# Patient Record
Sex: Female | Born: 1995 | Race: Black or African American | Hispanic: No | Marital: Single | Smoking: Never smoker
Health system: Southern US, Community
[De-identification: ages and names within clinical notes are randomized; demographics above are authoritative.]

## PROBLEM LIST (undated history)

## (undated) DIAGNOSIS — G8250 Quadriplegia, unspecified: Secondary | ICD-10-CM

## (undated) HISTORY — PX: TRACHEOSTOMY: SUR1362

---

## 2016-11-13 HISTORY — PX: GASTROSTOMY W/ FEEDING TUBE: SUR642

## 2017-12-02 ENCOUNTER — Other Ambulatory Visit: Payer: Self-pay | Admitting: *Deleted

## 2017-12-02 ENCOUNTER — Ambulatory Visit: Payer: Medicaid Other | Admitting: Podiatry

## 2017-12-02 ENCOUNTER — Encounter: Payer: Self-pay | Admitting: Podiatry

## 2017-12-02 DIAGNOSIS — L600 Ingrowing nail: Secondary | ICD-10-CM | POA: Diagnosis not present

## 2017-12-02 DIAGNOSIS — L03032 Cellulitis of left toe: Secondary | ICD-10-CM

## 2017-12-02 MED ORDER — CEPHALEXIN 500 MG PO CAPS: 500.0000 mg | ORAL_CAPSULE | Freq: Two times a day (BID) | ORAL | 0 refills | Status: AC

## 2017-12-02 NOTE — Progress Notes (Signed)
This patient presents to the office with chief complaint of a possible ingrown toenail right foot.  She is brought to the office by EMS for her painful big toe.  She is also accompanied by a caregiver. 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  The guardian states that she's had significant pain and discomfort upon pressure to the right big toe.  She has a red growth noted along the inside border right big toe.  She has had Neosporin applied to the site, but the problem persists.  Patient also is taking eliquiss.  She presents the office today for an evaluation and treatment of her painful ingrown toenail.  General Appearance  Alert, conversant and in no acute stress.  Vascular  Dorsalis pedis and posterior tibial  pulses are palpable  bilaterally.  Capillary return is within normal limits  bilaterally. Temperature is within normal limits  bilaterally.  Neurologic  Deferred due to treating patient in her stretcher.  Nails marked incurvation noted along the medial border of the right great toenail with significant amount of granulation tissue.  Orthopedic  No limitations of motion of motion feet .  No crepitus or effusions noted.  No bony pathology or digital deformities noted.  Skin  normotropic skin with no porokeratosis noted bilaterally.  No signs of infections or ulcers noted.    Paronychia right hallux  Ingrown toenail right hallux.  IE  Incision and drainage of right great toenail.    Treatment options and alternatives discussed.  Recommended an incision and drainage and guardian  agreed.  Right hallux  was prepped with alcohol and a 3cc. of  2% lidocaine plain was administered in a digital block fashion.  The toe was then prepped with betadine solution .  The offending nail border was then excised and all granulation  tissue was resected.  The area was then cleansed  and antibiotic ointment and a dry sterile dressing was applied. F ollowing  the procedure she had a tendency to bleed.  During the procedure the guardian stated she had stopped her Eliquiss. since she felt nail surgery was required.  Phenol was also used to cauterize the granulation tissue and limit bleeding.  She was held in the stretcher until signs of bleeding stopped. The patient was dispensed instructions for aftercare. Cephalexin 500 mg was prescribed.  Home instructions were reviewed with guardian.  Home care instructions were sent to the home health workers.  RTC 2 weeks as needed.     Helane GuntherGregory Raylyn Carton DPM

## 2017-12-09 ENCOUNTER — Ambulatory Visit: Payer: Medicaid Other | Admitting: Podiatry

## 2018-01-30 ENCOUNTER — Ambulatory Visit
Admission: RE | Admit: 2018-01-30 | Discharge: 2018-01-30 | Disposition: A | Discharge status: Home / Self Care | Payer: Medicaid Other | Source: Ambulatory Visit | Attending: Family Medicine | Admitting: Family Medicine

## 2018-01-30 ENCOUNTER — Other Ambulatory Visit: Payer: Self-pay | Admitting: Family Medicine

## 2018-01-30 DIAGNOSIS — M439 Deforming dorsopathy, unspecified: Secondary | ICD-10-CM | POA: Diagnosis not present

## 2018-07-22 ENCOUNTER — Other Ambulatory Visit: Payer: Self-pay

## 2018-07-22 ENCOUNTER — Encounter: Payer: Medicaid Other | Attending: Physician Assistant | Admitting: Physician Assistant

## 2018-07-22 DIAGNOSIS — Z7901 Long term (current) use of anticoagulants: Secondary | ICD-10-CM | POA: Insufficient documentation

## 2018-07-22 DIAGNOSIS — Z930 Tracheostomy status: Secondary | ICD-10-CM | POA: Diagnosis not present

## 2018-07-22 DIAGNOSIS — Z87820 Personal history of traumatic brain injury: Secondary | ICD-10-CM | POA: Insufficient documentation

## 2018-07-22 DIAGNOSIS — G8250 Quadriplegia, unspecified: Secondary | ICD-10-CM | POA: Diagnosis not present

## 2018-07-22 DIAGNOSIS — L89313 Pressure ulcer of right buttock, stage 3: Secondary | ICD-10-CM | POA: Diagnosis not present

## 2018-07-22 DIAGNOSIS — Z8249 Family history of ischemic heart disease and other diseases of the circulatory system: Secondary | ICD-10-CM | POA: Insufficient documentation

## 2018-07-22 DIAGNOSIS — F0390 Unspecified dementia without behavioral disturbance: Secondary | ICD-10-CM | POA: Diagnosis not present
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
## 2018-07-23 NOTE — Progress Notes (Signed)
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 Record Number: [id number] Patient Account Number: [id number] Date of Birth/Sex: 17-Aug-1995 (23 y.o. 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

## 2018-07-23 NOTE — Progress Notes (Signed)
WHITTNEY, STEENSON ([id number]) Visit Report for 07/22/2018 Allergy List Details Patient Name: Kelsey Strong, Kelsey Strong. Date of Service: 07/22/2018 1:00 PM Medical Record Number: [id number] Patient Account Number: [id number] Date of Birth/Sex: 06-02-1995 (23 y.o. F) Treating RN: Rodell Perna Primary Care Zacharius Funari: Beverely Low Other Clinician: Referring Gennette Shadix: Beverely Low Treating Lean Jaeger/Extender: STONE III, HOYT Weeks in Treatment: 0 Allergies Active Allergies no known alergies Allergy Notes Electronic Signature(s) Signed: 07/22/2018 3:57:00 PM By: Rodell Perna Entered By: Rodell Perna on 07/22/2018 13:03:48 Kelsey Strong ([id number]) -------------------------------------------------------------------------------- Arrival Information Details Patient Name: Kelsey Strong. Date of Service: 07/22/2018 1:00 PM Medical Record Number: [id number] Patient Account Number: [id number] Date of Birth/Sex: 05-Jun-1995 (23 y.o. F) Treating RN: Rodell Perna Primary Care Morty Ortwein: Beverely Low Other Clinician: Referring Marquarius Lofton: Beverely Low Treating Mamta Rimmer/Extender: Linwood Dibbles, HOYT Weeks in Treatment: 0 Visit Information Patient Arrived: Stretcher Arrival Time: 12:58 Accompanied By: mother Transfer Assistance: Stretcher Patient Identification Verified: Yes Electronic Signature(s) Signed: 07/22/2018 3:57:00 PM By: Rodell Perna Entered By: Rodell Perna on 07/22/2018 12:59:06 Kelsey Strong ([id number]) -------------------------------------------------------------------------------- Clinic Level of Care Assessment Details Patient Name: Kelsey Strong. Date of Service: 07/22/2018 1:00 PM Medical Record Number: [id number] Patient Account Number: [id number] Date of Birth/Sex: [birth year] (23 y.o. F) Treating RN: Curtis Sites Primary Care Veronika Heard: Beverely Low Other Clinician: Referring Shaneese Tait: Beverely Low Treating Kewon Statler/Extender: STONE III, HOYT Weeks in Treatment:  0 Clinic Level of Care Assessment Items TOOL 2 Quantity Score  - Use when only an EandM is performed on the INITIAL visit 0 ASSESSMENTS - Nursing Assessment / Reassessment X - General Physical Exam (combine w/ comprehensive assessment (listed just below) when 1 20 performed on new pt. evals) X- 1 25 Comprehensive Assessment (HX, ROS, Risk Assessments, Wounds Hx, etc.) ASSESSMENTS - Wound and Skin Assessment / Reassessment X - Simple Wound Assessment / Reassessment - one wound 1 5  - 0 Complex Wound Assessment / Reassessment - multiple wounds  - 0 Dermatologic / Skin Assessment (not related to wound area) ASSESSMENTS - Ostomy and/or Continence Assessment and Care  - Incontinence Assessment and Management 0  - 0 Ostomy Care Assessment and Management (repouching, etc.) PROCESS - Coordination of Care X - Simple Patient / Family Education for ongoing care 1 15  - 0 Complex (extensive) Patient / Family Education for ongoing care X- 1 10 Staff obtains Chiropractor, Records, Test Results / Process Orders  - 0 Staff telephones HHA, Nursing Homes / Clarify orders / etc  - 0 Routine Transfer to another Facility (non-emergent condition)  - 0 Routine Hospital Admission (non-emergent condition) X- 1 15 New Admissions / Manufacturing engineer / Ordering NPWT, Apligraf, etc.  - 0 Emergency Hospital Admission (emergent condition) X- 1 10 Simple Discharge Coordination  - 0 Complex (extensive) Discharge Coordination PROCESS - Special Needs  - Pediatric / Minor Patient Management 0  - 0 Isolation Patient Management Kelsey Strong, Kelsey Strong ([id number])  - 0 Hearing / Language / Visual special needs  - 0 Assessment of Community assistance (transportation, D/C planning, etc.)  - 0 Additional assistance / Altered mentation  - 0 Support Surface(s) Assessment (bed, cushion, seat, etc.) INTERVENTIONS - Wound Cleansing / Measurement X - Wound Imaging (photographs  - any number of wounds) 1 5  - 0 Wound Tracing (instead of photographs) X- 1 5 Simple Wound Measurement - one wound  - 0 Complex Wound Measurement - multiple wounds X- 1 5 Simple Wound Cleansing - one wound  - 0 Complex Wound Cleansing - multiple wounds INTERVENTIONS -  Wound Dressings X - Small Wound Dressing one or multiple wounds 1 10 []  - 0 Medium Wound Dressing one or multiple wounds []  - 0 Large Wound Dressing one or multiple wounds []  - 0 Application of Medications - injection INTERVENTIONS - Miscellaneous []  - External ear exam 0 []  - 0 Specimen Collection (cultures, biopsies, blood, body fluids, etc.) []  - 0 Specimen(s) / Culture(s) sent or taken to Lab for analysis []  - 0 Patient Transfer (multiple staff / Michiel Sites Lift / Similar devices) []  - 0 Simple Staple / Suture removal (25 or less) []  - 0 Complex Staple / Suture removal (26 or more) []  - 0 Hypo / Hyperglycemic Management (close monitor of Blood Glucose) X- 1 15 Ankle / Brachial Index (ABI) - do not check if billed separately Has the patient been seen at the hospital within the last three years: Yes Total Score: 140 Level Of Care: New/Established - Level 4 Electronic Signature(s) Signed: 07/22/2018 4:44:55 PM By: Curtis Sites Entered By: Curtis Sites on 07/22/2018 13:41:04 Kelsey Strong ([id number]) -------------------------------------------------------------------------------- Encounter Discharge Information Details Patient Name: Kelsey Strong. Date of Service: 07/22/2018 1:00 PM Medical Record Number: [id number] Patient Account Number: [id number] Date of Birth/Sex: 1995/10/11 (23 y.o. F) Treating RN: Arnette Norris Primary Care Rakin Lemelle: Beverely Low Other Clinician: Referring Maeley Matton: Beverely Low Treating Adaleigh Warf/Extender: Linwood Dibbles, HOYT Weeks in Treatment: 0 Encounter Discharge Information Items Discharge Condition: Stable Ambulatory Status: Stretcher Discharge Destination:  Home Transportation: Private Auto Accompanied By: family Schedule Follow-up Appointment: Yes Clinical Summary of Care: Electronic Signature(s) Signed: 07/22/2018 2:31:42 PM By: Arnette Norris Entered By: Arnette Norris on 07/22/2018 14:31:42 Kelsey Strong ([id number]) -------------------------------------------------------------------------------- Lower Extremity Assessment Details Patient Name: Kelsey Strong. Date of Service: 07/22/2018 1:00 PM Medical Record Number: [id number] Patient Account Number: [id number] Date of Birth/Sex: 04/25/1996 (23 y.o. F) Treating RN: Rodell Perna Primary Care Kayleigh Broadwell: Beverely Low Other Clinician: Referring Zaidan Keeble: Beverely Low Treating Maisie Hauser/Extender: Linwood Dibbles, HOYT Weeks in Treatment: 0 Electronic Signature(s) Signed: 07/22/2018 3:57:00 PM By: Rodell Perna Entered By: Rodell Perna on 07/22/2018 13:22:52 Kelsey Strong ([id number]) -------------------------------------------------------------------------------- Multi Wound Chart Details Patient Name: Kelsey Strong. Date of Service: 07/22/2018 1:00 PM Medical Record Number: [id number] Patient Account Number: [id number] Date of Birth/Sex: 01/13/1996 (23 y.o. F) Treating RN: Curtis Sites Primary Care Cearra Portnoy: Beverely Low Other Clinician: Referring Andreka Stucki: Beverely Low Treating Raela Bohl/Extender: STONE III, HOYT Weeks in Treatment: 0 Vital Signs Height(in): 66 Pulse(bpm): 85 Weight(lbs): 150 Blood Pressure(mmHg): 110/77 Body Mass Index(BMI): 24 Temperature(F): 97.8 Respiratory Rate 16 (breaths/min): Photos: [1:No Photos] [N/A:N/A] Wound Location: [1:Right Gluteal fold] [N/A:N/A] Wounding Event: [1:Pressure Injury] [N/A:N/A] Primary Etiology: [1:Pressure Ulcer] [N/A:N/A] Date Acquired: [1:06/26/2018] [N/A:N/A] Weeks of Treatment: [1:0] [N/A:N/A] Wound Status: [1:Open] [N/A:N/A] Measurements L x W x D [1:1.7x1.5x0.1] [N/A:N/A] (cm) Area (cm) : [1:2.003]  [N/A:N/A] Volume (cm) : [1:0.2] [N/A:N/A] % Reduction in Area: [1:0.00%] [N/A:N/A] % Reduction in Volume: [1:0.00%] [N/A:N/A] Classification: [1:Category/Stage III] [N/A:N/A] Exudate Amount: [1:None Present] [N/A:N/A] Wound Margin: [1:Flat and Intact] [N/A:N/A] Granulation Amount: [1:Medium (34-66%)] [N/A:N/A] Granulation Quality: [1:Pink, Hyper-granulation] [N/A:N/A] Necrotic Amount: [1:Medium (34-66%)] [N/A:N/A] Necrotic Tissue: [1:Eschar, Adherent Slough] [N/A:N/A] Exposed Structures: [1:Fat Layer (Subcutaneous Tissue) Exposed: Yes Fascia: No Tendon: No Muscle: No Joint: No Bone: No] [N/A:N/A] Epithelialization: [1:Small (1-33%)] [N/A:N/A] Periwound Skin Texture: [1:Scarring: Yes Excoriation: No Induration: No Callus: No Crepitus: No Rash: No] [N/A:N/A] Periwound Skin Moisture: [1:Dry/Scaly: Yes Maceration: No] [N/A:N/A] Periwound Skin Color: [N/A:N/A] Atrophie Blanche: No Cyanosis: No Ecchymosis: No Erythema: No Hemosiderin Staining: No Mottled: No  Pallor: No Rubor: No Temperature: No Abnormality N/A N/A Tenderness on Palpation: Yes N/A N/A Wound Preparation: Ulcer Cleansing: N/A N/A Rinsed/Irrigated with Saline Topical Anesthetic Applied: Other: lidocaine 4% Treatment Notes Electronic Signature(s) Signed: 07/22/2018 4:44:55 PM By: Curtis Sites Entered By: Curtis Sites on 07/22/2018 13:40:22 Kelsey Strong ([id number]) -------------------------------------------------------------------------------- Multi-Disciplinary Care Plan Details Patient Name: Kelsey Strong. Date of Service: 07/22/2018 1:00 PM Medical Record Number: [id number] Patient Account Number: [id number] Date of Birth/Sex: Dec 04, 1995 (23 y.o. F) Treating RN: Curtis Sites Primary Care Kassius Battiste: Beverely Low Other Clinician: Referring Jarita Raval: Beverely Low Treating Yanira Tolsma/Extender: Linwood Dibbles, HOYT Weeks in Treatment: 0 Active Inactive Nutrition Nursing Diagnoses: Potential for  alteratiion in Nutrition/Potential for imbalanced nutrition Goals: Patient/caregiver agrees to and verbalizes understanding of need to use nutritional supplements and/or vitamins as prescribed Date Initiated: 07/22/2018 Target Resolution Date: 10/11/2018 Goal Status: Active Interventions: Assess patient nutrition upon admission and as needed per policy Notes: Orientation to the Wound Care Program Nursing Diagnoses: Knowledge deficit related to the wound healing center program Goals: Patient/caregiver will verbalize understanding of the Wound Healing Center Program Date Initiated: 07/22/2018 Target Resolution Date: 10/11/2018 Goal Status: Active Interventions: Provide education on orientation to the wound center Notes: Pressure Nursing Diagnoses: Potential for impaired tissue integrity related to pressure, friction, moisture, and shear Goals: Patient will remain free from development of additional pressure ulcers Date Initiated: 07/22/2018 Target Resolution Date: 10/11/2018 Goal Status: Active Interventions: Assess potential for pressure ulcer upon admission and as needed Kelsey Strong, Kelsey Strong ([id number]) Notes: Wound/Skin Impairment Nursing Diagnoses: Impaired tissue integrity Goals: Ulcer/skin breakdown will heal within 14 weeks Date Initiated: 07/22/2018 Target Resolution Date: 10/11/2018 Goal Status: Active Interventions: Assess patient/caregiver ability to obtain necessary supplies Assess patient/caregiver ability to perform ulcer/skin care regimen upon admission and as needed Assess ulceration(s) every visit Notes: Electronic Signature(s) Signed: 07/22/2018 4:44:55 PM By: Curtis Sites Entered By: Curtis Sites on 07/22/2018 13:40:05 Kelsey Strong ([id number]) -------------------------------------------------------------------------------- Pain Assessment Details Patient Name: Kelsey Strong. Date of Service: 07/22/2018 1:00 PM Medical Record Number:  [id number] Patient Account Number: [id number] Date of Birth/Sex: 05-Jul-1995 (23 y.o. F) Treating RN: Rodell Perna Primary Care Vedanshi Massaro: Beverely Low Other Clinician: Referring Konnor Vondrasek: Beverely Low Treating Meosha Castanon/Extender: Linwood Dibbles, HOYT Weeks in Treatment: 0 Active Problems Location of Pain Severity and Description of Pain Patient Has Paino Patient Unable to Respond Site Locations Pain Management and Medication Current Pain Management: Electronic Signature(s) Signed: 07/22/2018 3:57:00 PM By: Rodell Perna Entered By: Rodell Perna on 07/22/2018 13:02:23 Kelsey Strong ([id number]) -------------------------------------------------------------------------------- Patient/Caregiver Education Details Patient Name: Kelsey Strong. Date of Service: 07/22/2018 1:00 PM Medical Record Number: [id number] Patient Account Number: [id number] Date of Birth/Gender: 08-01-1995 (23 y.o. F) Treating RN: Curtis Sites Primary Care Physician: Beverely Low Other Clinician: Referring Physician: Beverely Low Treating Physician/Extender: Linwood Dibbles, HOYT Weeks in Treatment: 0 Education Assessment Education Provided To: Caregiver Education Topics Provided Nutrition: Handouts: Other: increased protein for wound healing Methods: Explain/Verbal Pressure: Handouts: Preventing Pressure Ulcers Methods: Explain/Verbal Responses: State content correctly Wound/Skin Impairment: Handouts: Other: wound care as ordered Methods: Demonstration, Explain/Verbal Responses: State content correctly Electronic Signature(s) Signed: 07/22/2018 4:48:10 PM By: Arnette Norris Entered By: Arnette Norris on 07/22/2018 14:31:48 Kelsey Strong ([id number]) -------------------------------------------------------------------------------- Wound Assessment Details Patient Name: Kelsey Addison D. Date of Service: 07/22/2018 1:00 PM Medical Record Number: [id number] Patient Account Number: [id number] Date of  Birth/Sex: 1995/09/01 (23 y.o. F) Treating RN: Rodell Perna Primary Care Marland Reine: Beverely Low Other Clinician: Referring Quinnlan Abruzzo: Beverely Low Treating Naja Apperson/Extender: Larina Bras  III, HOYT Weeks in Treatment: 0 Wound Status Wound Number: 1 Primary Etiology: Pressure Ulcer Wound Location: Right Gluteal fold Wound Status: Open Wounding Event: Pressure Injury Date Acquired: 06/26/2018 Weeks Of Treatment: 0 Clustered Wound: No Photos Photo Uploaded By: Rodell Perna on 07/22/2018 13:45:19 Wound Measurements Length: (cm) 1.7 Width: (cm) 1.5 Depth: (cm) 0.1 Area: (cm) 2.003 Volume: (cm) 0.2 % Reduction in Area: 0% % Reduction in Volume: 0% Epithelialization: Small (1-33%) Tunneling: No Undermining: No Wound Description Classification: Category/Stage III Foul Odo Wound Margin: Flat and Intact Slough/F Exudate Amount: None Present r After Cleansing: No ibrino Yes Wound Bed Granulation Amount: Medium (34-66%) Exposed Structure Granulation Quality: Pink, Hyper-granulation Fascia Exposed: No Necrotic Amount: Medium (34-66%) Fat Layer (Subcutaneous Tissue) Exposed: Yes Necrotic Quality: Eschar, Adherent Slough Tendon Exposed: No Muscle Exposed: No Joint Exposed: No Bone Exposed: No Periwound Skin Texture Texture Color No Abnormalities Noted: No No Abnormalities Noted: No Kelsey Strong, LAVE. ([id number]) Callus: No Atrophie Blanche: No Crepitus: No Cyanosis: No Excoriation: No Ecchymosis: No Induration: No Erythema: No Rash: No Hemosiderin Staining: No Scarring: Yes Mottled: No Pallor: No Moisture Rubor: No No Abnormalities Noted: No Dry / Scaly: Yes Temperature / Pain Maceration: No Temperature: No Abnormality Tenderness on Palpation: Yes Wound Preparation Ulcer Cleansing: Rinsed/Irrigated with Saline Topical Anesthetic Applied: Other: lidocaine 4%, Treatment Notes Wound #1 (Right Gluteal fold) Notes silver collagen, BFD Electronic  Signature(s) Signed: 07/22/2018 3:57:00 PM By: Rodell Perna Signed: 07/22/2018 5:25:20 PM By: Lenda Kelp PA-C Entered By: Lenda Kelp on 07/22/2018 13:35:40 Kelsey Strong ([id number]) -------------------------------------------------------------------------------- Vitals Details Patient Name: Kelsey Strong. Date of Service: 07/22/2018 1:00 PM Medical Record Number: [id number] Patient Account Number: [id number] Date of Birth/Sex: 06-Sep-1995 (23 y.o. F) Treating RN: Rodell Perna Primary Care Tehran Rabenold: Beverely Low Other Clinician: Referring Lyncoln Ledgerwood: Beverely Low Treating Marieanne Marxen/Extender: STONE III, HOYT Weeks in Treatment: 0 Vital Signs Time Taken: 13:02 Temperature (F): 97.8 Height (in): 66 Pulse (bpm): 85 Source: Stated Respiratory Rate (breaths/min): 16 Weight (lbs): 150 Blood Pressure (mmHg): 110/77 Source: Stated Reference Range: 80 - 120 mg / dl Body Mass Index (BMI): 24.2 Electronic Signature(s) Signed: 07/22/2018 3:57:00 PM By: Rodell Perna Entered By: Rodell Perna on 07/22/2018 13:03:17

## 2018-07-29 ENCOUNTER — Encounter: Payer: Medicaid Other | Admitting: Physician Assistant

## 2018-07-29 ENCOUNTER — Other Ambulatory Visit: Payer: Self-pay

## 2018-07-29 DIAGNOSIS — L89313 Pressure ulcer of right buttock, stage 3: Secondary | ICD-10-CM | POA: Diagnosis not present

## 2018-07-31 NOTE — Progress Notes (Addendum)
Kelsey, Strong ([id number]) Visit Report for 07/29/2018 Arrival Information Details Patient Name: Kelsey, Strong. Date of Service: 07/29/2018 1:15 PM Medical Record Number: [id number] Patient Account Number: [id number] Date of Birth/Sex: 1995/05/08 (23 y.o. Female) Treating RN: Curtis Sites Primary Care Darline Faith: Beverely Low Other Clinician: Referring Zyann Mabry: Beverely Low Treating Shaguana Love/Extender: Linwood Dibbles, HOYT Weeks in Treatment: 1 Visit Information History Since Last Visit Added or deleted any medications: No Patient Arrived: Stretcher Any new allergies or adverse reactions: No Arrival Time: 12:53 Had a fall or experienced change in No Accompanied By: mother activities of daily living that may affect Transfer Assistance: Manual risk of falls: Patient Identification Verified: Yes Signs or symptoms of abuse/neglect since last visito No Secondary Verification Process Completed: Yes Hospitalized since last visit: No Implantable device outside of the clinic excluding No cellular tissue based products placed in the center since last visit: Has Dressing in Place as Prescribed: Yes Pain Present Now: No Electronic Signature(s) Signed: 07/29/2018 4:07:26 PM By: Dayton Martes RCP, RRT, CHT Entered By: Dayton Martes on 07/29/2018 12:54:33 Kelsey Strong ([id number]) -------------------------------------------------------------------------------- Clinic Level of Care Assessment Details Patient Name: Kelsey Strong. Date of Service: 07/29/2018 1:15 PM Medical Record Number: [id number] Patient Account Number: [id number] Date of Birth/Sex: 1995-07-23 (23 y.o. Female) Treating RN: Curtis Sites Primary Care Venicia Vandall: Beverely Low Other Clinician: Referring Atia Haupt: Beverely Low Treating Shanyah Gattuso/Extender: Linwood Dibbles, HOYT Weeks in Treatment: 1 Clinic Level of Care Assessment Items TOOL 4 Quantity Score []  - Use when only an EandM is  performed on FOLLOW-UP visit 0 ASSESSMENTS - Nursing Assessment / Reassessment X - Reassessment of Co-morbidities (includes updates in patient status) 1 10 X- 1 5 Reassessment of Adherence to Treatment Plan ASSESSMENTS - Wound and Skin Assessment / Reassessment X - Simple Wound Assessment / Reassessment - one wound 1 5 []  - 0 Complex Wound Assessment / Reassessment - multiple wounds []  - 0 Dermatologic / Skin Assessment (not related to wound area) ASSESSMENTS - Focused Assessment []  - Circumferential Edema Measurements - multi extremities 0 []  - 0 Nutritional Assessment / Counseling / Intervention []  - 0 Lower Extremity Assessment (monofilament, tuning fork, pulses) []  - 0 Peripheral Arterial Disease Assessment (using hand held doppler) ASSESSMENTS - Ostomy and/or Continence Assessment and Care []  - Incontinence Assessment and Management 0 []  - 0 Ostomy Care Assessment and Management (repouching, etc.) PROCESS - Coordination of Care X - Simple Patient / Family Education for ongoing care 1 15 []  - 0 Complex (extensive) Patient / Family Education for ongoing care X- 1 10 Staff obtains Chiropractor, Records, Test Results / Process Orders []  - 0 Staff telephones HHA, Nursing Homes / Clarify orders / etc []  - 0 Routine Transfer to another Facility (non-emergent condition) []  - 0 Routine Hospital Admission (non-emergent condition) []  - 0 New Admissions / Manufacturing engineer / Ordering NPWT, Apligraf, etc. []  - 0 Emergency Hospital Admission (emergent condition) X- 1 10 Simple Discharge Coordination Kelsey Strong, Kelsey Strong ([id number]) []  - 0 Complex (extensive) Discharge Coordination PROCESS - Special Needs []  - Pediatric / Minor Patient Management 0 []  - 0 Isolation Patient Management []  - 0 Hearing / Language / Visual special needs []  - 0 Assessment of Community assistance (transportation, D/C planning, etc.) []  - 0 Additional assistance / Altered mentation []  -  0 Support Surface(s) Assessment (bed, cushion, seat, etc.) INTERVENTIONS - Wound Cleansing / Measurement X - Simple Wound Cleansing - one wound 1 5 []  - 0 Complex Wound Cleansing - multiple wounds  X- 1 5 Wound Imaging (photographs - any number of wounds) []  - 0 Wound Tracing (instead of photographs) X- 1 5 Simple Wound Measurement - one wound []  - 0 Complex Wound Measurement - multiple wounds INTERVENTIONS - Wound Dressings X - Small Wound Dressing one or multiple wounds 1 10 []  - 0 Medium Wound Dressing one or multiple wounds []  - 0 Large Wound Dressing one or multiple wounds []  - 0 Application of Medications - topical []  - 0 Application of Medications - injection INTERVENTIONS - Miscellaneous []  - External ear exam 0 []  - 0 Specimen Collection (cultures, biopsies, blood, body fluids, etc.) []  - 0 Specimen(s) / Culture(s) sent or taken to Lab for analysis []  - 0 Patient Transfer (multiple staff / Nurse, adult / Similar devices) []  - 0 Simple Staple / Suture removal (25 or less) []  - 0 Complex Staple / Suture removal (26 or more) []  - 0 Hypo / Hyperglycemic Management (close monitor of Blood Glucose) []  - 0 Ankle / Brachial Index (ABI) - do not check if billed separately X- 1 5 Vital Signs Kelsey Strong, Kelsey D. ([id number]) Has the patient been seen at the hospital within the last three years: Yes Total Score: 85 Level Of Care: New/Established - Level 3 Electronic Signature(s) Signed: 07/29/2018 5:06:46 PM By: Curtis Sites Entered By: Curtis Sites on 07/29/2018 14:31:08 Kelsey Strong ([id number]) -------------------------------------------------------------------------------- Encounter Discharge Information Details Patient Name: Kelsey Strong. Date of Service: 07/29/2018 1:15 PM Medical Record Number: [id number] Patient Account Number: [id number] Date of Birth/Sex: 1995-05-29 (23 y.o. Female) Treating RN: Arnette Norris Primary Care Resa Rinks: Beverely Low Other Clinician: Referring Lynnell Fiumara: Beverely Low Treating Khaden Gater/Extender: Linwood Dibbles, HOYT Weeks in Treatment: 1 Encounter Discharge Information Items Discharge Condition: Stable Ambulatory Status: Stretcher Discharge Destination: Home Transportation: Ambulance Accompanied By: family Schedule Follow-up Appointment: Yes Clinical Summary of Care: Electronic Signature(s) Signed: 07/29/2018 2:37:51 PM By: Arnette Norris Entered By: Arnette Norris on 07/29/2018 14:37:51 Kelsey Strong ([id number]) -------------------------------------------------------------------------------- Lower Extremity Assessment Details Patient Name: Kelsey Strong. Date of Service: 07/29/2018 1:15 PM Medical Record Number: [id number] Patient Account Number: [id number] Date of Birth/Sex: 03-04-1996 (23 y.o. Female) Treating RN: Huel Coventry Primary Care Darci Lykins: Beverely Low Other Clinician: Referring Benton Tooker: Beverely Low Treating Keslie Gritz/Extender: Skeet Simmer in Treatment: 1 Electronic Signature(s) Signed: 07/31/2018 9:14:45 AM By: Elliot Gurney, BSN, RN, CWS, Kim RN, BSN Entered By: Elliot Gurney, BSN, RN, CWS, Kim on 07/29/2018 13:18:54 Kelsey Strong ([id number]) -------------------------------------------------------------------------------- Multi Wound Chart Details Patient Name: Kelsey Strong. Date of Service: 07/29/2018 1:15 PM Medical Record Number: [id number] Patient Account Number: [id number] Date of Birth/Sex: 11/18/1995 (23 y.o. Female) Treating RN: Curtis Sites Primary Care Kamron Vanwyhe: Beverely Low Other Clinician: Referring Shiri Hodapp: Beverely Low Treating Orvie Caradine/Extender: STONE III, HOYT Weeks in Treatment: 1 Vital Signs Height(in): 66 Pulse(bpm): 91 Weight(lbs): 150 Blood Pressure(mmHg): 114/77 Body Mass Index(BMI): 24 Temperature(F): 98.7 Respiratory Rate 16 (breaths/min): Photos: [N/A:N/A] Wound Location: Right Gluteal fold N/A N/A Wounding Event: Pressure  Injury N/A N/A Primary Etiology: Pressure Ulcer N/A N/A Date Acquired: 06/26/2018 N/A N/A Weeks of Treatment: 1 N/A N/A Wound Status: Open N/A N/A Measurements L x W x D 0.7x1x0.1 N/A N/A (cm) Area (cm) : 0.55 N/A N/A Volume (cm) : 0.055 N/A N/A % Reduction in Area: 72.50% N/A N/A % Reduction in Volume: 72.50% N/A N/A Classification: Category/Stage III N/A N/A Exudate Amount: Small N/A N/A Exudate Type: Serous N/A N/A Exudate Color: amber N/A N/A Wound Margin: Flat and Intact N/A N/A Granulation Amount:  Medium (34-66%) N/A N/A Granulation Quality: Pink N/A N/A Necrotic Amount: Medium (34-66%) N/A N/A Exposed Structures: Fat Layer (Subcutaneous N/A N/A Tissue) Exposed: Yes Fascia: No Tendon: No Muscle: No Joint: No Bone: No Epithelialization: Small (1-33%) N/A N/A Wound Preparation: N/A N/A Kelsey Strong, Kelsey Strong ([id number]) Ulcer Cleansing: Rinsed/Irrigated with Saline Topical Anesthetic Applied: Other: lidocaine 4% Treatment Notes Electronic Signature(s) Signed: 07/29/2018 5:06:46 PM By: Curtis Sites Entered By: Curtis Sites on 07/29/2018 14:13:02 Kelsey Strong ([id number]) -------------------------------------------------------------------------------- Multi-Disciplinary Care Plan Details Patient Name: Kelsey Strong. Date of Service: 07/29/2018 1:15 PM Medical Record Number: [id number] Patient Account Number: [id number] Date of Birth/Sex: [birth year] (23 y.o. Female) Treating RN: Curtis Sites Primary Care Izumi Mixon: Beverely Low Other Clinician: Referring Stellar Gensel: Beverely Low Treating Tahje Borawski/Extender: Linwood Dibbles, HOYT Weeks in Treatment: 1 Active Inactive Electronic Signature(s) Signed: 10/01/2018 10:27:05 AM By: Elliot Gurney, BSN, RN, CWS, Kim RN, BSN Signed: 01/02/2019 1:04:33 PM By: Curtis Sites Previous Signature: 07/29/2018 5:06:46 PM Version By: Curtis Sites Entered By: Elliot Gurney BSN, RN, CWS, Kim on 10/01/2018 10:27:05 Kelsey Strong  ([id number]) -------------------------------------------------------------------------------- Pain Assessment Details Patient Name: Kelsey Strong, Kelsey Strong. Date of Service: 07/29/2018 1:15 PM Medical Record Number: [id number] Patient Account Number: [id number] Date of Birth/Sex: 28-May-1995 (23 y.o. Female) Treating RN: Curtis Sites Primary Care Acy Orsak: Beverely Low Other Clinician: Referring Erhard Senske: Beverely Low Treating Kenslee Achorn/Extender: Linwood Dibbles, HOYT Weeks in Treatment: 1 Active Problems Location of Pain Severity and Description of Pain Patient Has Paino Patient Unable to Respond Site Locations Pain Management and Medication Current Pain Management: Electronic Signature(s) Signed: 07/29/2018 4:07:26 PM By: Sallee Provencal, RRT, CHT Signed: 07/29/2018 5:06:46 PM By: Curtis Sites Entered By: Dayton Martes on 07/29/2018 12:54:43 Kelsey Strong ([id number]) -------------------------------------------------------------------------------- Patient/Caregiver Education Details Patient Name: Kelsey Strong. Date of Service: 07/29/2018 1:15 PM Medical Record Number: [id number] Patient Account Number: [id number] Date of Birth/Gender: 10/10/1995 (23 y.o. Female) Treating RN: Curtis Sites Primary Care Physician: Beverely Low Other Clinician: Referring Physician: Beverely Low Treating Physician/Extender: Skeet Simmer in Treatment: 1 Education Assessment Education Provided To: Caregiver Education Topics Provided Wound/Skin Impairment: Handouts: Other: wound care as ordered Methods: Demonstration, Explain/Verbal Responses: State content correctly Electronic Signature(s) Signed: 07/29/2018 5:26:11 PM By: Arnette Norris Entered By: Arnette Norris on 07/29/2018 14:37:59 Kelsey Strong ([id number]) -------------------------------------------------------------------------------- Wound Assessment Details Patient Name: Kelsey Strong. Date of Service: 07/29/2018 1:15 PM Medical Record Number: [id number] Patient Account Number: [id number] Date of Birth/Sex: [birth year] (23 y.o. Female) Treating RN: Huel Coventry Primary Care Marijane Trower: Beverely Low Other Clinician: Referring Davey Limas: Beverely Low Treating Azeneth Carbonell/Extender: Linwood Dibbles, HOYT Weeks in Treatment: 1 Wound Status Wound Number: 1 Primary Etiology: Pressure Ulcer Wound Location: Right Gluteal fold Wound Status: Open Wounding Event: Pressure Injury Date Acquired: 06/26/2018 Weeks Of Treatment: 1 Clustered Wound: No Photos Wound Measurements Length: (cm) 0.7 % Reduction Width: (cm) 1 % Reduction Depth: (cm) 0.1 Epithelializ Area: (cm) 0.55 Tunneling: Volume: (cm) 0.055 Undermining in Area: 72.5% in Volume: 72.5% ation: Small (1-33%) No : No Wound Description Classification: Category/Stage III Foul Odor Af Wound Margin: Flat and Intact Slough/Fibri Exudate Amount: Small Exudate Type: Serous Exudate Color: amber ter Cleansing: No no Yes Wound Bed Granulation Amount: Medium (34-66%) Exposed Structure Granulation Quality: Pink Fascia Exposed: No Necrotic Amount: Medium (34-66%) Fat Layer (Subcutaneous Tissue) Exposed: Yes Necrotic Quality: Adherent Slough Tendon Exposed: No Muscle Exposed: No Joint Exposed: No Bone Exposed: No Periwound Skin Texture Texture Color Kelsey Strong, Kelsey D. ([id number]) No Abnormalities Noted: No No Abnormalities Noted:  No Callus: No Atrophie Blanche: No Crepitus: No Cyanosis: No Excoriation: No Ecchymosis: No Induration: No Erythema: No Rash: No Hemosiderin Staining: Yes Scarring: Yes Mottled: No Pallor: No Moisture Rubor: No No Abnormalities Noted: No Dry / Scaly: No Temperature / Pain Maceration: No Temperature: No Abnormality Tenderness on Palpation: Yes Wound Preparation Ulcer Cleansing: Rinsed/Irrigated with Saline Topical Anesthetic Applied: Other: lidocaine 4%, Electronic  Signature(s) Signed: 07/31/2018 9:14:45 AM By: Elliot Gurney, BSN, RN, CWS, Kim RN, BSN Entered By: Elliot Gurney, BSN, RN, CWS, Kim on 07/29/2018 13:20:54 Kelsey Strong ([id number]) -------------------------------------------------------------------------------- Vitals Details Patient Name: Kelsey Strong. Date of Service: 07/29/2018 1:15 PM Medical Record Number: [id number] Patient Account Number: [id number] Date of Birth/Sex: [birth year] (23 y.o. Female) Treating RN: Curtis Sites Primary Care Olyver Hawes: Beverely Low Other Clinician: Referring Analisse Randle: Beverely Low Treating Simrin Vegh/Extender: Linwood Dibbles, HOYT Weeks in Treatment: 1 Vital Signs Time Taken: 12:54 Temperature (F): 98.7 Height (in): 66 Pulse (bpm): 91 Weight (lbs): 150 Respiratory Rate (breaths/min): 16 Body Mass Index (BMI): 24.2 Blood Pressure (mmHg): 114/77 Reference Range: 80 - 120 mg / dl Electronic Signature(s) Signed: 07/29/2018 4:07:26 PM By: Dayton Martes RCP, RRT, CHT Entered By: Dayton Martes on 07/29/2018 12:55:16
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
## 2018-08-05 ENCOUNTER — Ambulatory Visit: Payer: Medicaid Other | Admitting: Physician Assistant

## 2019-04-03 ENCOUNTER — Emergency Department
Admission: EM | Admit: 2019-04-03 | Discharge: 2019-04-03 | Disposition: A | Discharge status: Home / Self Care | Payer: Medicaid Other | Attending: Emergency Medicine | Admitting: Emergency Medicine

## 2019-04-03 ENCOUNTER — Emergency Department: Payer: Medicaid Other

## 2019-04-03 ENCOUNTER — Other Ambulatory Visit: Payer: Self-pay

## 2019-04-03 ENCOUNTER — Encounter: Payer: Self-pay | Admitting: Emergency Medicine

## 2019-04-03 DIAGNOSIS — Z79899 Other long term (current) drug therapy: Secondary | ICD-10-CM | POA: Diagnosis not present

## 2019-04-03 DIAGNOSIS — Y732 Prosthetic and other implants, materials and accessory gastroenterology and urology devices associated with adverse incidents: Secondary | ICD-10-CM | POA: Insufficient documentation

## 2019-04-03 DIAGNOSIS — Z7901 Long term (current) use of anticoagulants: Secondary | ICD-10-CM | POA: Diagnosis not present

## 2019-04-03 DIAGNOSIS — T85598A Other mechanical complication of other gastrointestinal prosthetic devices, implants and grafts, initial encounter: Secondary | ICD-10-CM | POA: Insufficient documentation

## 2019-04-03 NOTE — ED Notes (Signed)
[facility] here to transport pt home.
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
## 2019-04-03 NOTE — ED Notes (Signed)
Discussed tachycardia with dr. Archie Balboa, rate 122, who orders discharge with this knowledge.

## 2019-04-03 NOTE — Discharge Instructions (Addendum)
Please seek medical attention for any high fevers, chest pain, shortness of breath, change in behavior, persistent vomiting, bloody stool or any other new or concerning symptoms.  

## 2019-04-03 NOTE — ED Notes (Signed)
EMS arrived to transport pt.

## 2019-04-03 NOTE — ED Notes (Signed)
md at bedside attempting g tube insertion.

## 2019-07-17 ENCOUNTER — Ambulatory Visit
Admission: RE | Admit: 2019-07-17 | Discharge: 2019-07-17 | Disposition: A | Discharge status: Home / Self Care | Payer: Medicare Other | Source: Ambulatory Visit | Attending: Psychiatry | Admitting: Psychiatry

## 2019-07-17 ENCOUNTER — Other Ambulatory Visit: Payer: Self-pay | Admitting: Psychiatry

## 2019-07-17 DIAGNOSIS — G8250 Quadriplegia, unspecified: Secondary | ICD-10-CM

## 2019-08-23 ENCOUNTER — Other Ambulatory Visit: Payer: Self-pay

## 2019-08-23 ENCOUNTER — Emergency Department
Admission: EM | Admit: 2019-08-23 | Discharge: 2019-08-23 | Disposition: A | Discharge status: Home / Self Care | Payer: Medicare Other | Attending: Emergency Medicine | Admitting: Emergency Medicine

## 2019-08-23 ENCOUNTER — Emergency Department: Payer: Medicare Other

## 2019-08-23 ENCOUNTER — Encounter: Payer: Self-pay | Admitting: Emergency Medicine

## 2019-08-23 DIAGNOSIS — Z79899 Other long term (current) drug therapy: Secondary | ICD-10-CM | POA: Insufficient documentation

## 2019-08-23 DIAGNOSIS — Z7901 Long term (current) use of anticoagulants: Secondary | ICD-10-CM | POA: Diagnosis not present

## 2019-08-23 DIAGNOSIS — K9423 Gastrostomy malfunction: Secondary | ICD-10-CM | POA: Insufficient documentation

## 2019-08-23 DIAGNOSIS — T85528A Displacement of other gastrointestinal prosthetic devices, implants and grafts, initial encounter: Secondary | ICD-10-CM

## 2019-08-23 HISTORY — DX: Quadriplegia, unspecified: G82.50

## 2019-08-23 NOTE — Discharge Instructions (Addendum)
Return to the ER for recurrent or worsening symptoms, persistent vomiting, difficulty breathing or other concerns. 
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
## 2019-08-23 NOTE — ED Triage Notes (Signed)
Pt arrives via [facility] with c/o feeding tube being dislodged. Pt was seen yesterday at [facility] for the same and tube has been dislodged as well at this time.

## 2019-11-27 ENCOUNTER — Other Ambulatory Visit: Payer: Self-pay

## 2019-11-27 ENCOUNTER — Emergency Department: Payer: Medicare Other

## 2019-11-27 ENCOUNTER — Emergency Department
Admission: EM | Admit: 2019-11-27 | Discharge: 2019-11-27 | Disposition: A | Discharge status: Home / Self Care | Payer: Medicare Other | Attending: Emergency Medicine | Admitting: Emergency Medicine

## 2019-11-27 ENCOUNTER — Encounter: Payer: Self-pay | Admitting: Emergency Medicine

## 2019-11-27 DIAGNOSIS — Z859 Personal history of malignant neoplasm, unspecified: Secondary | ICD-10-CM | POA: Insufficient documentation

## 2019-11-27 DIAGNOSIS — Z4659 Encounter for fitting and adjustment of other gastrointestinal appliance and device: Secondary | ICD-10-CM

## 2019-11-27 MED ORDER — DIATRIZOATE MEGLUMINE & SODIUM 66-10 % PO SOLN
30.0000 mL | Freq: Once | ORAL | Status: AC
  Administered 2019-11-27: 30 mL

## 2019-11-27 NOTE — ED Provider Notes (Signed)
  ER Provider Note       Time seen: 1:02 PM    I have reviewed the vital signs and the nursing notes.  HISTORY Level V caveat: History/ROS limited by altered mental status Chief Complaint No chief complaint on file.   HPI Kelsey Strong is a 24 y.o. female with a history of quadriplegic spinal paralysis who presents today for feeding tube dysfunction.  Caregiver states that at the top of the feeding tube the feeding tube is cracked and leaking gastric contents.  Is alert but nonverbal.   Past Medical History:  Diagnosis Date  . Quadriplegic spinal paralysis [facility])     Past Surgical History:  Procedure Laterality Date  . GASTROSTOMY W/ FEEDING TUBE Left 11/13/2016  . TRACHEOSTOMY N/A     Allergies Patient has no known allergies.  Review of Systems Unknown, positive for feeding tube dysfunction All systems negative/normal/unremarkable except as stated in the HPI  ____________________________________________  FEEDING TUBE REPLACEMENT  Date/Time: 11/27/2019 1:26 PM Performed by: Emily Filbert, MD Authorized by: Emily Filbert, MD  Indications: tube malfunction Local anesthesia used: no  Anesthesia: Local anesthesia used: no  Sedation: Patient sedated: no  Tube type: gastrostomy Patient position: supine Tube size: 18 Fr Endoscope used: no Bulb inflation volume: 20 (ml) Placement/position confirmation: x-ray Tube placement difficulty: none     PHYSICAL EXAM:  VITAL SIGNS: There were no vitals filed for this visit.  Constitutional: Alert, no acute distress Cardiovascular: Normal rate, regular rhythm. No murmurs, rubs, or gallops. Respiratory: Normal respiratory effort without tachypnea nor retractions. Breath sounds are clear and equal bilaterally. No wheezes/rales/rhonchi. Gastrointestinal: Soft and nontender. Normal bowel sounds.  Feeding tube is broken Musculoskeletal: Contractures of the extremities Skin:  Skin is warm, dry and  intact. No rash noted.  RADIOLOGY  Images were viewed by me KUB with Gastrografin  DIFFERENTIAL DIAGNOSIS  Feeding tube replacement, feeding tube dysfunction  ASSESSMENT AND PLAN  Feeding tube replacement   Plan: The patient had presented for feeding tube dysfunction.  X-ray confirmed proper location and function, she is cleared for outpatient follow-up.  Daryel November MD    Note: This note was generated in part or whole with voice recognition software. Voice recognition is usually quite accurate but there are transcription errors that can and very often do occur. I apologize for any typographical errors that were not detected and corrected.     Emily Filbert, MD 11/27/19 [phone]

## 2019-11-27 NOTE — ED Notes (Signed)
Pt taken for xray

## 2019-11-27 NOTE — ED Triage Notes (Signed)
Feeding tube broken on the end.  18 french.  Patient is alert, non verbal.

## 2019-12-18 IMAGING — DX DG ABDOMEN 1V
1 series · 1 of 1 positions shown · non-contrast
Comparison: Lumbar radiograph 01/30/2018

CLINICAL DATA: G-tube placement

EXAM:
ABDOMEN - 1 VIEW

[abdomen supine]
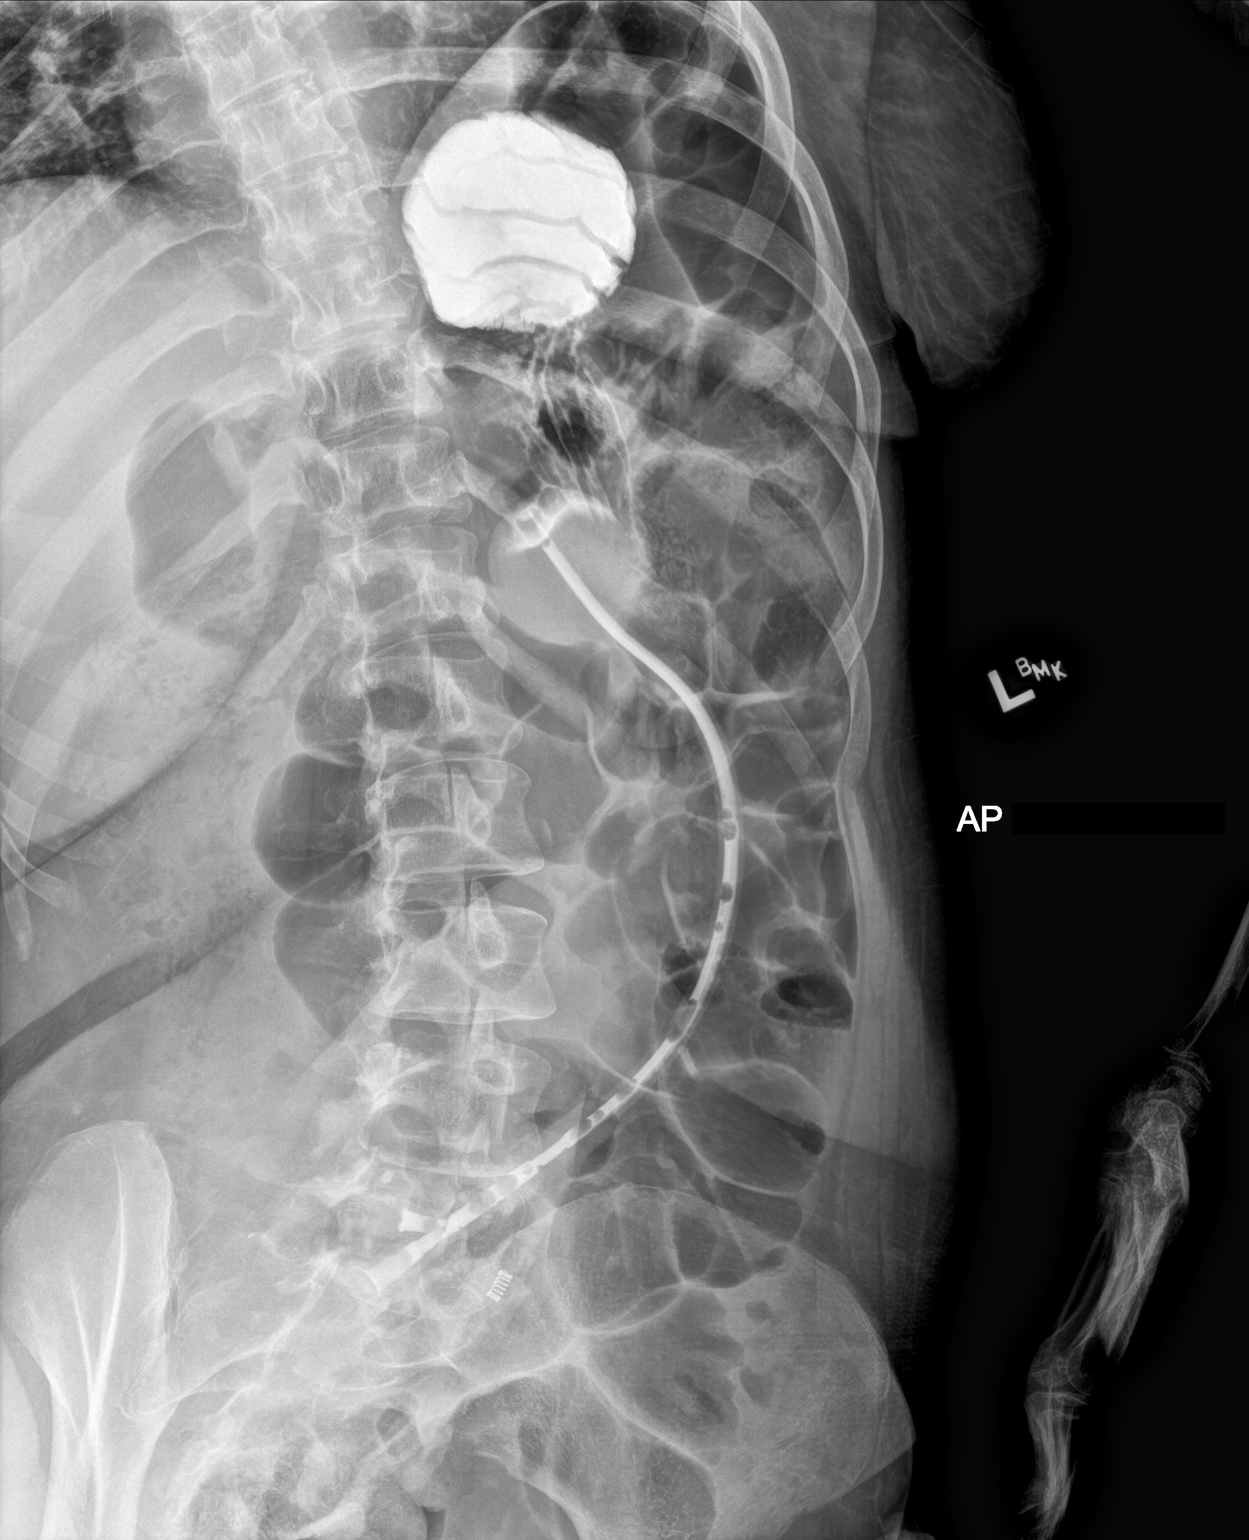

[1 of 1 positions shown; findings below may reference images not displayed]

FINDINGS: Percutaneous gastrostomy tube is in place with the catheter lumen
filled with radiopaque contrast media. An additional bolus of
contrast media outlines the rugal folds of the stomach compatible
with a successful placement of G-tube. No extraluminal contrast is
evident. Multiple loops of air distended bowel are noted in the left
abdomen.
IMPRESSION: 1. Percutaneous gastrostomy tube appears to be in satisfactory
position.
2. Multiple loops of air distended bowel in the left abdomen.
Correlate for features of obstruction or ileus.

## 2020-02-08 ENCOUNTER — Ambulatory Visit: Payer: Medicare Other | Admitting: Physician Assistant

## 2020-03-01 ENCOUNTER — Other Ambulatory Visit
Admission: RE | Admit: 2020-03-01 | Discharge: 2020-03-01 | Disposition: A | Discharge status: Home / Self Care | Payer: Medicare Other | Source: Ambulatory Visit | Attending: Physician Assistant | Admitting: Physician Assistant

## 2020-03-01 ENCOUNTER — Other Ambulatory Visit: Payer: Self-pay

## 2020-03-01 ENCOUNTER — Encounter: Payer: Medicare Other | Attending: Physician Assistant | Admitting: Physician Assistant

## 2020-03-01 DIAGNOSIS — L89894 Pressure ulcer of other site, stage 4: Secondary | ICD-10-CM | POA: Insufficient documentation

## 2020-03-01 DIAGNOSIS — B999 Unspecified infectious disease: Secondary | ICD-10-CM | POA: Insufficient documentation

## 2020-03-02 LAB — AEROBIC CULTURE  (SUPERFICIAL SPECIMEN)

## 2020-03-03 NOTE — Progress Notes (Signed)
RYELLE, RUVALCABA ([id number]) Visit Report for 03/01/2020 Abuse/Suicide Risk Screen Details Patient Name: Kelsey Strong, Kelsey Strong. Date of Service: 03/01/2020 12:45 PM Medical Record Number: [id number] Patient Account Number: [id number] Date of Birth/Sex: 09-28-1995 (24 y.o. F) Treating RN: Huel Coventry Primary Care Calissa Swenor: Beverely Low Other Clinician: Referring Vada Yellen: Beverely Low Treating Josearmando Kuhnert/Extender: Rowan Blase in Treatment: 0 Abuse/Suicide Risk Screen Items Answer ABUSE RISK SCREEN: Has anyone close to you tried to hurt or harm you recentlyo No Do you feel uncomfortable with anyone in your familyo No Has anyone forced you do things that you didnot want to doo No Electronic Signature(s) Signed: 03/01/2020 4:48:14 PM By: Zenaida Deed RN, BSN Signed: 03/02/2020 5:48:54 PM By: Elliot Gurney, BSN, RN, CWS, Kim RN, BSN Entered By: Zenaida Deed on 03/01/2020 13:21:39 Kelsey Strong ([id number]) -------------------------------------------------------------------------------- Activities of Daily Living Details Patient Name: Kelsey Strong, Kelsey Strong. Date of Service: 03/01/2020 12:45 PM Medical Record Number: [id number] Patient Account Number: [id number] Date of Birth/Sex: 1995-11-20 (24 y.o. F) Treating RN: Huel Coventry Primary Care Jaylei Fuerte: Beverely Low Other Clinician: Referring Edynn Gillock: Beverely Low Treating Harsh Trulock/Extender: Rowan Blase in Treatment: 0 Activities of Daily Living Items Answer Activities of Daily Living (Please select one for each item) Drive Automobile Not Able Take Medications Need Assistance Use Telephone Not Able Care for Appearance Not Able Use Toilet Not Able Mady Haagensen / Shower Not Able Dress Self Not Able Feed Self Not Able Walk Not Able Get In / Out Bed Not Able Housework Not Able Prepare Meals Not Able Handle Money Not Able Shop for Self Not Able Electronic Signature(s) Signed: 03/01/2020 4:48:14 PM By: Zenaida Deed RN,  BSN Signed: 03/02/2020 5:48:54 PM By: Elliot Gurney, BSN, RN, CWS, Kim RN, BSN Entered By: Zenaida Deed on 03/01/2020 13:22:06 Kelsey Strong ([id number]) -------------------------------------------------------------------------------- Education Screening Details Patient Name: Kelsey Strong, Kelsey Strong. Date of Service: 03/01/2020 12:45 PM Medical Record Number: [id number] Patient Account Number: [id number] Date of Birth/Sex: Aug 21, 1995 (24 y.o. F) Treating RN: Huel Coventry Primary Care Posey Petrik: Beverely Low Other Clinician: Referring Kevonte Vanecek: Beverely Low Treating Evangelynn Lochridge/Extender: Rowan Blase in Treatment: 0 Primary Learner Assessed: Caregiver Reason Patient is not Primary Learner: cognitively impaired Learning Preferences/Education Level/Primary Language Learning Preference: Explanation, Demonstration, Printed Material Preferred Language: English Cognitive Barrier Language Barrier: No Translator Needed: No Emotional Barrier: No Cultural/Religious Beliefs Affecting Medical Care: No Physical Barrier Impaired Vision: No Impaired Hearing: No Decreased Hand dexterity: Yes Electronic Signature(s) Signed: 03/01/2020 4:48:14 PM By: Zenaida Deed RN, BSN Signed: 03/02/2020 5:48:54 PM By: Elliot Gurney, BSN, RN, CWS, Kim RN, BSN Entered By: Zenaida Deed on 03/01/2020 13:23:09 Kelsey Strong ([id number]) -------------------------------------------------------------------------------- Fall Risk Assessment Details Patient Name: Kelsey Strong. Date of Service: 03/01/2020 12:45 PM Medical Record Number: [id number] Patient Account Number: [id number] Date of Birth/Sex: Jul 05, 1995 (24 y.o. F) Treating RN: Huel Coventry Primary Care Oluwadamilare Tobler: Beverely Low Other Clinician: Referring Bradey Luzier: Beverely Low Treating Jelesa Mangini/Extender: Rowan Blase in Treatment: 0 Fall Risk Assessment Items Have you had 2 or more falls in the last 12 monthso 0 No Have you had any fall that resulted in  injury in the last 12 monthso 0 No FALLS RISK SCREEN History of falling - immediate or within 3 months 0 No Secondary diagnosis (Do you have 2 or more medical diagnoseso) 0 No Ambulatory aid None/bed rest/wheelchair/nurse 0 Yes Crutches/cane/walker 0 No Furniture 0 No Intravenous therapy Access/Saline/Heparin Lock 0 No Gait/Transferring Normal/ bed rest/ wheelchair 0 Yes Weak (short steps with or without shuffle, stooped but able to lift head while  walking, may 0 No seek support from furniture) Impaired (short steps with shuffle, may have difficulty arising from chair, head down, impaired 0 No balance) Mental Status Oriented to own ability 0 No Electronic Signature(s) Signed: 03/01/2020 4:48:14 PM By: Zenaida Deed RN, BSN Signed: 03/02/2020 5:48:54 PM By: Elliot Gurney, BSN, RN, CWS, Kim RN, BSN Entered By: Zenaida Deed on 03/01/2020 13:23:35 Kelsey Strong ([id number]) -------------------------------------------------------------------------------- Foot Assessment Details Patient Name: Kelsey Strong, Kelsey Strong. Date of Service: 03/01/2020 12:45 PM Medical Record Number: [id number] Patient Account Number: [id number] Date of Birth/Sex: 01-21-1996 (24 y.o. F) Treating RN: Huel Coventry Primary Care Raiyan Dalesandro: Beverely Low Other Clinician: Referring Nike Southers: Beverely Low Treating Brynja Marker/Extender: Rowan Blase in Treatment: 0 Foot Assessment Items Site Locations + = Sensation present, - = Sensation absent, C = Callus, U = Ulcer R = Redness, W = Warmth, M = Maceration, PU = Pre-ulcerative lesion F = Fissure, S = Swelling, D = Dryness Assessment Right: Left: Other Deformity: No No Prior Foot Ulcer: No No Prior Amputation: No No Charcot Joint: No No Ambulatory Status: Non-ambulatory Assistance Device: Stretcher Gait: Psychologist, prison and probation services) Signed: 03/01/2020 4:48:14 PM By: Zenaida Deed RN, BSN Signed: 03/02/2020 5:48:54 PM By: Elliot Gurney, BSN, RN, CWS, Kim RN, BSN Entered  By: Zenaida Deed on 03/01/2020 13:24:00 Kelsey Strong ([id number]) -------------------------------------------------------------------------------- Nutrition Risk Screening Details Patient Name: Kelsey Strong, Kelsey Strong. Date of Service: 03/01/2020 12:45 PM Medical Record Number: [id number] Patient Account Number: [id number] Date of Birth/Sex: Feb 20, 1996 (24 y.o. F) Treating RN: Huel Coventry Primary Care Mashonda Broski: Beverely Low Other Clinician: Referring Aesha Agrawal: Beverely Low Treating Keiondre Colee/Extender: Rowan Blase in Treatment: 0 Height (in): 66 Weight (lbs): 120 Body Mass Index (BMI): 19.4 Nutrition Risk Screening Items Score Screening NUTRITION RISK SCREEN: I have an illness or condition that made me change the kind and/or amount of food I eat 2 Yes I eat fewer than two meals per day 0 No I eat few fruits and vegetables, or milk products 0 No I have three or more drinks of beer, liquor or wine almost every day 0 No I have tooth or mouth problems that make it hard for me to eat 0 No I don't always have enough money to buy the food I need 0 No I eat alone most of the time 0 No I take three or more different prescribed or over-the-counter drugs a day 0 No Without wanting to, I have lost or gained 10 pounds in the last six months 0 No I am not always physically able to shop, cook and/or feed myself 0 No Nutrition Protocols Good Risk Protocol 0 No interventions needed Moderate Risk Protocol High Risk Proctocol Risk Level: Good Risk Score: 2 Electronic Signature(s) Signed: 03/01/2020 4:48:14 PM By: Zenaida Deed RN, BSN Signed: 03/02/2020 5:48:54 PM By: Elliot Gurney, BSN, RN, CWS, Kim RN, BSN Entered By: Zenaida Deed on 03/01/2020 13:23:51

## 2020-03-03 NOTE — Progress Notes (Addendum)
Kelsey Strong, Kelsey Strong ([id number]) Visit Report for 03/01/2020 Allergy List Details Patient Name: Kelsey Strong, Kelsey Strong. Date of Service: 03/01/2020 12:45 PM Medical Record Number: [id number] Patient Account Number: [id number] Date of Birth/Sex: [birth year] (24 y.o. F) Treating RN: Huel Coventry Primary Care Antaniya Venuti: Beverely Low Other Clinician: Referring Francelia Mclaren: Beverely Low Treating Micheal Murad/Extender: Allen Derry Weeks in Treatment: 0 Allergies Active Allergies No Known Allergies Allergy Notes Electronic Signature(s) Signed: 03/01/2020 4:48:14 PM By: Zenaida Deed RN, BSN Entered By: Zenaida Deed on 03/01/2020 13:18:01 Kelsey Strong ([id number]) -------------------------------------------------------------------------------- Arrival Information Details Patient Name: Kelsey Strong. Date of Service: 03/01/2020 12:45 PM Medical Record Number: [id number] Patient Account Number: [id number] Date of Birth/Sex: July 23, 1995 (24 y.o. F) Treating RN: Huel Coventry Primary Care Aleksander Edmiston: Beverely Low Other Clinician: Referring Anaih Brander: Beverely Low Treating Avana Kreiser/Extender: Rowan Blase in Treatment: 0 Visit Information Patient Arrived: Stretcher Arrival Time: 12:55 Accompanied By: mother Transfer Assistance: Manual Patient Identification Verified: Yes Secondary Verification Process Completed: Yes Patient Requires Transmission-Based Precautions: No Patient Has Alerts: No History Since Last Visit Electronic Signature(s) Signed: 03/01/2020 4:48:14 PM By: Zenaida Deed RN, BSN Entered By: Zenaida Deed on 03/01/2020 13:03:50 Kelsey Strong ([id number]) -------------------------------------------------------------------------------- Clinic Level of Care Assessment Details Patient Name: Kelsey Strong. Date of Service: 03/01/2020 12:45 PM Medical Record Number: [id number] Patient Account Number: [id number] Date of Birth/Sex: 02-Dec-1995 (24 y.o. F) Treating RN:  Huel Coventry Primary Care Roan Sawchuk: Beverely Low Other Clinician: Referring Sitara Cashwell: Beverely Low Treating Shyloh Derosa/Extender: Rowan Blase in Treatment: 0 Clinic Level of Care Assessment Items TOOL 2 Quantity Score []  - Use when only an EandM is performed on the INITIAL visit 0 ASSESSMENTS - Nursing Assessment / Reassessment X - General Physical Exam (combine w/ comprehensive assessment (listed just below) when performed on new 1 20 pt. evals) X- 1 25 Comprehensive Assessment (HX, ROS, Risk Assessments, Wounds Hx, etc.) ASSESSMENTS - Wound and Skin Assessment / Reassessment X - Simple Wound Assessment / Reassessment - one wound 1 5 []  - 0 Complex Wound Assessment / Reassessment - multiple wounds []  - 0 Dermatologic / Skin Assessment (not related to wound area) ASSESSMENTS - Ostomy and/or Continence Assessment and Care []  - Incontinence Assessment and Management 0 []  - 0 Ostomy Care Assessment and Management (repouching, etc.) PROCESS - Coordination of Care X - Simple Patient / Family Education for ongoing care 1 15 []  - 0 Complex (extensive) Patient / Family Education for ongoing care X- 1 10 Staff obtains , Records, Test Results / Process Orders []  - 0 Staff telephones HHA, Nursing Homes / Clarify orders / etc []  - 0 Routine Transfer to another Facility (non-emergent condition) []  - 0 Routine Hospital Admission (non-emergent condition) X- 1 15 New Admissions / / Ordering NPWT, Apligraf, etc. []  - 0 Emergency Hospital Admission (emergent condition) X- 1 10 Simple Discharge Coordination []  - 0 Complex (extensive) Discharge Coordination PROCESS - Special Needs []  - Pediatric / Minor Patient Management 0 []  - 0 Isolation Patient Management []  - 0 Hearing / Language / Visual special needs []  - 0 Assessment of Community assistance (transportation, D/C planning, etc.) []  - 0 Additional assistance / Altered mentation []  - 0 Support  Surface(s) Assessment (bed, cushion, seat, etc.) INTERVENTIONS - Wound Cleansing / Measurement X - Wound Imaging (photographs - any number of wounds) 1 5 []  - 0 Wound Tracing (instead of photographs) X- 1 5 Simple Wound Measurement - one wound []  - 0 Complex Wound Measurement - multiple wounds WAVERLY, TARQUINIO D. ( )  X- 1 5 Simple Wound Cleansing - one wound []  - 0 Complex Wound Cleansing - multiple wounds INTERVENTIONS - Wound Dressings X - Small Wound Dressing one or multiple wounds 1 10 []  - 0 Medium Wound Dressing one or multiple wounds []  - 0 Large Wound Dressing one or multiple wounds []  - 0 Application of Medications - injection INTERVENTIONS - Miscellaneous []  - External ear exam 0 X- 1 5 Specimen Collection (cultures, biopsies, blood, body fluids, etc.) X- 1 5 Specimen(s) / Culture(s) sent or taken to Lab for analysis []  - 0 Patient Transfer (multiple staff / / Similar devices) []  - 0 Simple Staple / Suture removal (25 or less) []  - 0 Complex Staple / Suture removal (26 or more) []  - 0 Hypo / Hyperglycemic Management (close monitor of Blood Glucose) []  - 0 Ankle / Brachial Index (ABI) - do not check if billed separately Has the patient been seen at the hospital within the last three years: Yes Total Score: 135 Level Of Care: New/Established - Level 4 Electronic Signature(s) Signed: 03/01/2020 4:48:14 PM By: RN, BSN Entered By: on 03/01/2020 13:45:40 (Nurse, adult) -------------------------------------------------------------------------------- Encounter Discharge Information Details Patient Name: . Date of Service: 03/01/2020 12:45 PM Medical Record Number: Patient Account Number: Date of Birth/Sex: Apr 29, 1996 (24 y.o. F) Treating RN: Zenaida Deed Primary Care Jenet Durio: 03/03/2020 Other Clinician: Referring Kerriann Kamphuis: Kelsey Strong Treating  Jayce Kainz/Extender: [id number] in Treatment: 0 Encounter Discharge Information Items Discharge Condition: Stable Ambulatory Status: Stretcher Discharge Destination: Home Transportation: Ambulance Accompanied By: mother, EMS Schedule Follow-up Appointment: Yes Clinical Summary of Care: Patient Declined Electronic Signature(s) Signed: 03/01/2020 4:48:14 PM By: 03/03/2020 RN, BSN Entered By: [id number] on 03/01/2020 13:57:20 [birth year]) -------------------------------------------------------------------------------- Lower Extremity Assessment Details Patient Name: Huel Coventry. Date of Service: 03/01/2020 12:45 PM Medical Record Number: Beverely Low Patient Account Number: Rowan Blase Date of Birth/Sex: 1996-02-19 (24 y.o. F) Treating RN: Zenaida Deed Primary Care Onda Kattner: 03/03/2020 Other Clinician: Referring Bre Pecina: Kelsey Strong Treating Raylee Adamec/Extender: [id number] in Treatment: 0 Electronic Signature(s) Signed: 03/01/2020 4:48:14 PM By: 03/03/2020 RN, BSN Signed: 03/02/2020 5:48:54 PM By: [id number] BSN, RN, CWS, Kim RN, BSN Entered By: 05/26/1995 on 03/01/2020 13:16:02 Huel Coventry (Beverely Low) -------------------------------------------------------------------------------- Multi Wound Chart Details Patient Name: Kelsey Strong, Kelsey Strong. Date of Service: 03/01/2020 12:45 PM Medical Record Number: 03/03/2020 Patient Account Number: Zenaida Deed Date of Birth/Sex: Apr 12, 1996 (24 y.o. F) Treating RN: Zenaida Deed Primary Care Marin Milley: 03/03/2020 Other Clinician: Referring Aundrea Horace: Kelsey Strong Treating Zacharius Funari/Extender: [id number] in Treatment: 0 Vital Signs Height(in): 66 Pulse(bpm): 80 Weight(lbs): 120 Blood Pressure(mmHg): 103/63 Body Mass Index(BMI): 19 Temperature(F): 98.3 Respiratory Rate(breaths/min): 18 Photos: [N/A:N/A] Wound Location: Right Ear N/A N/A Wounding Event: Pressure Injury N/A  N/A Primary Etiology: Pressure Ulcer N/A N/A Date Acquired: 12/14/2019 N/A N/A Weeks of Treatment: 0 N/A N/A Wound Status: Open N/A N/A Measurements L x W x D (cm) 2.2x1x0.2 N/A N/A Area (cm) : 1.728 N/A N/A Volume (cm) : 0.346 N/A N/A % Reduction in Area: 0.00% N/A N/A % Reduction in Volume: 0.00% N/A N/A Position 1 (o'clock): 1 Maximum Distance 1 (cm): 0.3 Tunneling: Yes N/A N/A Classification: Category/Stage IV N/A N/A Exudate Amount: Medium N/A N/A Exudate Type: Purulent N/A N/A Exudate Color: yellow, brown, green N/A N/A Wound Margin: Flat and Intact N/A N/A Granulation Amount: Medium (34-66%) N/A N/A Granulation Quality: Pink N/A N/A Necrotic Amount: Medium (34-66%)  N/A N/A Exposed Structures: Fat Layer (Subcutaneous Tissue): N/A N/A Yes Bone: Yes Fascia: No Tendon: No Muscle: No Joint: No Assessment Notes: cartilege exposed N/A N/A Treatment Notes Electronic Signature(s) Signed: 03/01/2020 4:48:14 PM By: Zenaida DeedBoehlein, Linda RN, BSN Entered By: Zenaida DeedBoehlein, Linda on 03/01/2020 13:32:38 Kelsey Strong, Kelsey D. ([id number]) -------------------------------------------------------------------------------- Multi-Disciplinary Care Plan Details Patient Name: Kelsey Strong, Kelsey D. Date of Service: 03/01/2020 12:45 PM Medical Record Number: [id number] Patient Account Number: [id number] Date of Birth/Sex: 09-12-1995 (24 y.o. F) Treating RN: Huel CoventryWoody, Kim Primary Care Terrie Haring: Beverely LowAdamo, Elena Other Clinician: Referring Arianah Torgeson: Beverely LowAdamo, Elena Treating Jacquelina Hewins/Extender: Rowan BlaseStone, Hoyt Weeks in Treatment: 0 Active Inactive Electronic Signature(s) Signed: 04/15/2020 3:15:10 PM By: Elliot GurneyWoody, BSN, RN, CWS, Kim RN, BSN Previous Signature: 03/01/2020 4:48:14 PM Version By: Zenaida DeedBoehlein, Linda RN, BSN Previous Signature: 03/02/2020 5:48:54 PM Version By: Elliot GurneyWoody, BSN, RN, CWS, Kim RN, BSN Entered By: Elliot GurneyWoody, BSN, RN, CWS, Kim on 04/15/2020 15:15:09 Kelsey Strong, Kelsey D.  ([id number]) -------------------------------------------------------------------------------- Pain Assessment Details Patient Name: Kelsey Strong, Kelsey D. Date of Service: 03/01/2020 12:45 PM Medical Record Number: [id number] Patient Account Number: [id number] Date of Birth/Sex: 09-12-1995 (24 y.o. F) Treating RN: Huel CoventryWoody, Kim Primary Care Marely Apgar: Beverely LowAdamo, Elena Other Clinician: Referring Rashied Corallo: Beverely LowAdamo, Elena Treating Jezelle Gullick/Extender: Rowan BlaseStone, Hoyt Weeks in Treatment: 0 Active Problems Location of Pain Severity and Description of Pain Patient Has Paino Yes Site Locations Pain Location: Pain in Ulcers With Dressing Change: Yes Duration of the Pain. Constant / Intermittento Intermittent Rate the pain. Current Pain Level: 4 Worst Pain Level: 8 Pain Management and Medication Current Pain Management: Other: reposition Is the Current Pain Management Adequate: Adequate How does your wound impact your activities of daily livingo Sleep: No Bathing: No Appetite: No Relationship With Others: No Bladder Continence: No Emotions: No Bowel Continence: No Hobbies: No Toileting: No Dressing: No Electronic Signature(s) Signed: 03/01/2020 4:48:14 PM By: Zenaida DeedBoehlein, Linda RN, BSN Signed: 03/02/2020 5:48:54 PM By: Elliot GurneyWoody, BSN, RN, CWS, Kim RN, BSN Entered By: Zenaida DeedBoehlein, Linda on 03/01/2020 13:04:56 Kelsey Strong, Kelsey D. ([id number]) -------------------------------------------------------------------------------- Patient/Caregiver Education Details Patient Name: Kelsey Strong, Kelsey D. Date of Service: 03/01/2020 12:45 PM Medical Record Number: [id number] Patient Account Number: [id number] Date of Birth/Gender: 09-12-1995 (24 y.o. F) Treating RN: Huel CoventryWoody, Kim Primary Care Physician: Beverely LowAdamo, Elena Other Clinician: Referring Physician: Beverely LowAdamo, Elena Treating Physician/Extender: Rowan BlaseStone, Hoyt Weeks in Treatment: 0 Education Assessment Education Provided To: Patient Education Topics  Provided Pressure: Methods: Explain/Verbal Responses: Reinforcements needed, State content correctly Wound/Skin Impairment: Methods: Explain/Verbal Responses: Reinforcements needed, State content correctly Electronic Signature(s) Signed: 03/01/2020 4:48:14 PM By: Zenaida DeedBoehlein, Linda RN, BSN Entered By: Zenaida DeedBoehlein, Linda on 03/01/2020 13:46:21 Kelsey Strong, Azzie D. ([id number]) -------------------------------------------------------------------------------- Wound Assessment Details Patient Name: Kelsey Strong, Yaira D. Date of Service: 03/01/2020 12:45 PM Medical Record Number: [id number] Patient Account Number: [id number] Date of Birth/Sex: 09-12-1995 (24 y.o. F) Treating RN: Huel CoventryWoody, Kim Primary Care Shanika Levings: Beverely LowAdamo, Elena Other Clinician: Referring Mykaylah Ballman: Beverely LowAdamo, Elena Treating Marisella Puccio/Extender: Rowan BlaseStone, Hoyt Weeks in Treatment: 0 Wound Status Wound Number: 2 Primary Etiology: Pressure Ulcer Wound Location: Right Ear Wound Status: Open Wounding Event: Pressure Injury Date Acquired: 12/14/2019 Weeks Of Treatment: 0 Clustered Wound: No Photos Wound Measurements Length: (cm) 2.2 Width: (cm) 1 Depth: (cm) 0.2 Area: (cm) 1.728 Volume: (cm) 0.346 % Reduction in Area: 0% % Reduction in Volume: 0% Tunneling: Yes Position (o'clock): 1 Maximum Distance: (cm) 0.3 Undermining: No Wound Description Classification: Category/Stage IV Wound Margin: Flat and Intact Exudate Amount: Medium Exudate Type: Purulent Exudate Color: yellow, brown, green Foul Odor After Cleansing: No Slough/Fibrino Yes Wound Bed Granulation Amount:  Medium (34-66%) Exposed Structure Granulation Quality: Pink Fascia Exposed: No Necrotic Amount: Medium (34-66%) Fat Layer (Subcutaneous Tissue) Exposed: Yes Necrotic Quality: Adherent Slough Tendon Exposed: No Muscle Exposed: No Joint Exposed: No Bone Exposed: Yes Assessment Notes cartilege exposed Electronic Signature(s) Signed: 03/01/2020 4:48:14 PM By:  Zenaida Deed RN, BSN Signed: 03/02/2020 5:48:54 PM By: Elliot Gurney, BSN, RN, CWS, Kim RN, BSN Entered By: Zenaida Deed on 03/01/2020 13:14:09 EMALIA, WITKOP ([id number]) MARBETH, SMEDLEY ([id number]) -------------------------------------------------------------------------------- Vitals Details Patient Name: VESSIE, OLMSTED. Date of Service: 03/01/2020 12:45 PM Medical Record Number: [id number] Patient Account Number: [id number] Date of Birth/Sex: [birth year] (24 y.o. F) Treating RN: Huel Coventry Primary Care Zohair Epp: Beverely Low Other Clinician: Referring Wells Mabe: Beverely Low Treating Ivadell Gaul/Extender: Rowan Blase in Treatment: 0 Vital Signs Time Taken: 13:10 Temperature (F): 98.3 Height (in): 66 Pulse (bpm): 80 Source: Stated Respiratory Rate (breaths/min): 18 Weight (lbs): 120 Blood Pressure (mmHg): 103/63 Source: Stated Reference Range: 80 - 120 mg / dl Body Mass Index (BMI): 19.4 Electronic Signature(s) Signed: 03/01/2020 4:48:14 PM By: Zenaida Deed RN, BSN Entered By: Zenaida Deed on 03/01/2020 13:11:31

## 2020-03-03 NOTE — Progress Notes (Addendum)
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 Thyroid Problems: No; Tuberculosis: No; Never smoker; Marital Status - Single; Alcohol Use: Never; Drug Use: No History; Caffeine Use: Never; Financial Concerns: No; Food, Clothing or Shelter Needs: No; Support System Lacking: No; Transportation Concerns: No Electronic Signature(s) Signed: 03/01/2020 4:48:14 PM By: Zenaida Deed RN, BSN Signed: 03/01/2020 5:11:17 PM By: Lenda Kelp PA-C Signed: 03/02/2020 5:48:54 PM By: Elliot Gurney BSN, RN, CWS, Kim RN, BSN Entered By: Zenaida Deed on 03/01/2020 13:21:30 Kelsey Strong ([id number]) -------------------------------------------------------------------------------- SuperBill Details Patient Name: Kelsey Strong. Date of Service: 03/01/2020 Medical Record Number: [id number] Patient Account Number: [id number] Date of Birth/Sex: 12-24-1995 (24 y.o. 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.01 Long term (current) use of anticoagulants Z93.0 Tracheostomy status Facility Procedures CPT4 Code: [id number] Description: 99214 - WOUND CARE VISIT-LEV 4 EST PT Modifier: Quantity: 1 Physician Procedures CPT4 Code: 4034742 Description: 99214 - WC PHYS LEVEL 4 - EST PT Modifier: Quantity: 1 CPT4  Code: Description: ICD-10 Diagnosis Description L89.894 Pressure ulcer of other site, stage 4 Z87.820 Personal history of traumatic brain injury Z79.01 Long term (current) use of anticoagulants 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

## 2020-03-04 LAB — AEROBIC CULTURE W GRAM STAIN (SUPERFICIAL SPECIMEN)

## 2020-03-15 ENCOUNTER — Ambulatory Visit: Payer: Medicare Other | Admitting: Physician Assistant

## 2020-04-01 IMAGING — CR DG PELVIS 1-2V
1 series · 1 of 1 positions shown · non-contrast
Comparison: None.

CLINICAL DATA: Quadriplegia. Truncal dystonia, pelvic alignment,
sacrum rotated which compromises skin integrity.

EXAM:
PELVIS - 1-2 VIEW

[dg pelvis 1-2 views]
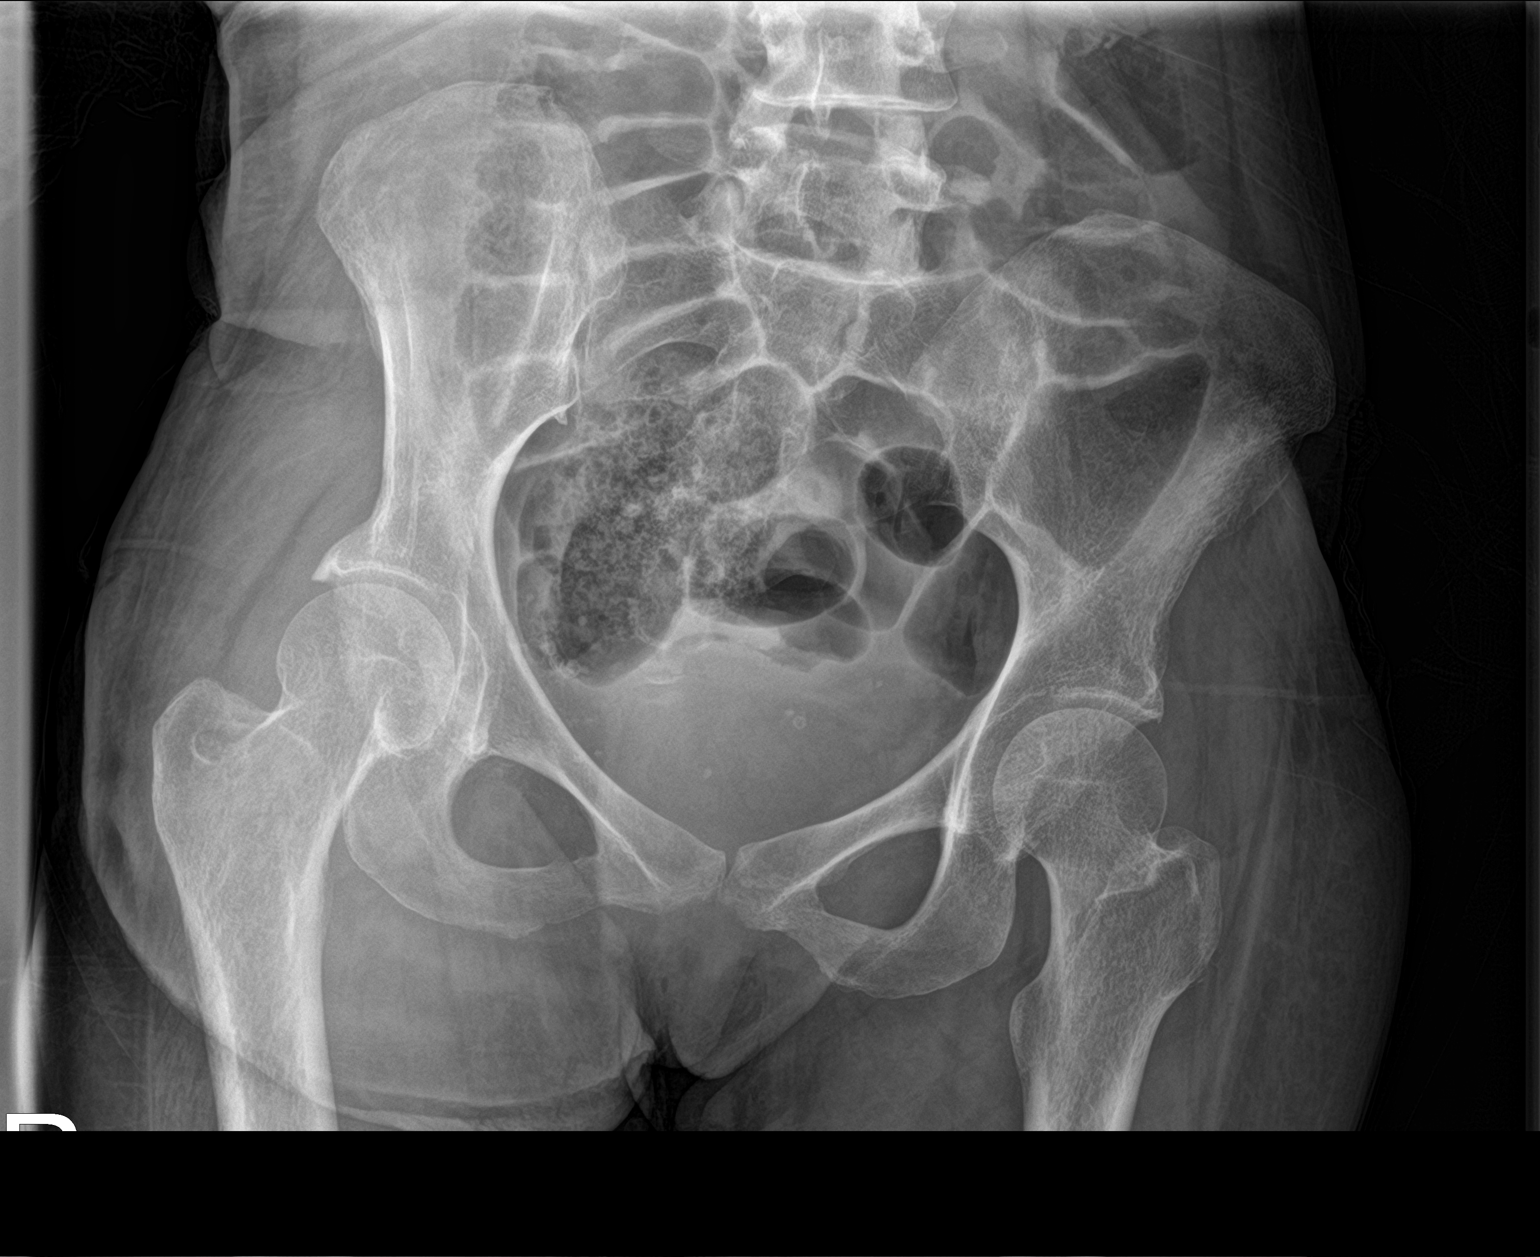

[1 of 1 positions shown; findings below may reference images not displayed]

FINDINGS: Patient is rotated. Cortical margins of the bony pelvis are intact.
No evidence of fracture or bone destruction. The sacrum is obscured
by overlying stool and bowel gas. The sacroiliac joints are grossly
congruent. Femoral heads are seated in the acetabulum. No obvious
decubitus ulcer on single AP view.
IMPRESSION: Rotated exam without evidence of acute abnormality. Sacrum obscured
by overlying stool and bowel gas. No obvious decubitus ulcer on
single AP view.

## 2021-02-23 ENCOUNTER — Encounter: Payer: Medicare HMO | Attending: Physician Assistant | Admitting: Physician Assistant

## 2021-02-23 ENCOUNTER — Other Ambulatory Visit: Payer: Self-pay

## 2021-02-23 DIAGNOSIS — G8250 Quadriplegia, unspecified: Secondary | ICD-10-CM | POA: Insufficient documentation

## 2021-02-23 DIAGNOSIS — Z87820 Personal history of traumatic brain injury: Secondary | ICD-10-CM | POA: Diagnosis not present

## 2021-02-23 DIAGNOSIS — L89819 Pressure ulcer of head, unspecified stage: Secondary | ICD-10-CM | POA: Insufficient documentation

## 2021-02-23 DIAGNOSIS — Z930 Tracheostomy status: Secondary | ICD-10-CM | POA: Diagnosis not present

## 2021-02-23 DIAGNOSIS — Z7901 Long term (current) use of anticoagulants: Secondary | ICD-10-CM | POA: Diagnosis not present

## 2021-02-23 NOTE — Progress Notes (Signed)
DECLYNN, LOPRESTI ([id number]) Visit Report for 02/23/2021 Abuse/Suicide Risk Screen Details Patient Name: Kelsey Strong, Kelsey Strong. Date of Service: 02/23/2021 12:45 PM Medical Record Number: [id number] Patient Account Number: [id number] Date of Birth/Sex: 1996-03-25 (25 y.o. F) Treating RN: Rogers Blocker Primary Care Kaijah Abts: Beverely Low Other Clinician: Referring Valita Righter: Referral, Self Treating Feather Berrie/Extender: Rowan Blase in Treatment: 0 Abuse/Suicide Risk Screen Items Answer ABUSE RISK SCREEN: Has anyone close to you tried to hurt or harm you recentlyo No Do you feel uncomfortable with anyone in your familyo No Has anyone forced you do things that you didnot want to doo No Electronic Signature(s) Signed: 02/23/2021 4:40:38 PM By: Rogers Blocker RN Entered By: Rogers Blocker on 02/23/2021 12:42:46 Kelsey Strong ([id number]) -------------------------------------------------------------------------------- Activities of Daily Living Details Patient Name: Kelsey Strong. Date of Service: 02/23/2021 12:45 PM Medical Record Number: [id number] Patient Account Number: [id number] Date of Birth/Sex: 01/29/1996 (25 y.o. F) Treating RN: Rogers Blocker Primary Care Aleathia Purdy: Beverely Low Other Clinician: Referring Elica Almas: Referral, Self Treating Aveion Nguyen/Extender: Rowan Blase in Treatment: 0 Activities of Daily Living Items Answer Activities of Daily Living (Please select one for each item) Drive Automobile Not Able Take Medications Not Able Use Telephone Not Able Care for Appearance Not Able Use Toilet Not Able Mady Haagensen / Shower Not Able Dress Self Not Able Feed Self Not Able Walk Not Able Get In / Out Bed Not Able Housework Not Able Prepare Meals Not Able Handle Money Not Able Shop for Self Not Able Electronic Signature(s) Signed: 02/23/2021 4:40:38 PM By: Rogers Blocker RN Entered By: Rogers Blocker on 02/23/2021 12:47:21 Kelsey Strong  ([id number]) -------------------------------------------------------------------------------- Education Screening Details Patient Name: Kelsey Strong. Date of Service: 02/23/2021 12:45 PM Medical Record Number: [id number] Patient Account Number: [id number] Date of Birth/Sex: 1996/01/19 (25 y.o. F) Treating RN: Rogers Blocker Primary Care Alexee Delsanto: Beverely Low Other Clinician: Referring Levette Paulick: Referral, Self Treating Jeannie Mallinger/Extender: Rowan Blase in Treatment: 0 Primary Learner Assessed: Caregiver Mother Reason Patient is not Primary Learner: patient nonverbal/TBI Learning Preferences/Education Level/Primary Language Preferred Language: Metallurgist) Signed: 02/23/2021 4:40:38 PM By: Rogers Blocker RN Entered By: Rogers Blocker on 02/23/2021 12:47:43 Kelsey Strong ([id number]) -------------------------------------------------------------------------------- Fall Risk Assessment Details Patient Name: Kelsey Strong. Date of Service: 02/23/2021 12:45 PM Medical Record Number: [id number] Patient Account Number: [id number] Date of Birth/Sex: May 07, 1996 (25 y.o. F) Treating RN: Rogers Blocker Primary Care Aparna Vanderweele: Beverely Low Other Clinician: Referring Myson Levi: Referral, Self Treating Vashti Bolanos/Extender: Rowan Blase in Treatment: 0 Fall Risk Assessment Items Have you had 2 or more falls in the last 12 monthso 0 No Have you had any fall that resulted in injury in the last 12 monthso 0 No FALLS RISK SCREEN History of falling - immediate or within 3 months 0 No Secondary diagnosis (Do you have 2 or more medical diagnoseso) 0 No Ambulatory aid None/bed rest/wheelchair/nurse 0 Yes Crutches/cane/walker 0 No Furniture 0 No Intravenous therapy Access/Saline/Heparin Lock 0 No Gait/Transferring Normal/ bed rest/ wheelchair 0 Yes Weak (short steps with or without shuffle, stooped but able to lift head while walking, may 0 No seek support from  furniture) Impaired (short steps with shuffle, may have difficulty arising from chair, head down, impaired 0 No balance) Mental Status Oriented to own ability 0 No Electronic Signature(s) Signed: 02/23/2021 4:40:38 PM By: Rogers Blocker RN Entered By: Rogers Blocker on 02/23/2021 12:47:59 Kelsey Strong ([id number]) -------------------------------------------------------------------------------- Foot Assessment Details Patient Name: Kelsey Strong. Date of Service: 02/23/2021 12:45 PM Medical Record Number: [id number]  Patient Account Number: [id number] Date of Birth/Sex: Mar 10, 1996 (25 y.o. F) Treating RN: Rogers Blocker Primary Care Anchor Dwan: Beverely Low Other Clinician: Referring Shanie Mauzy: Referral, Self Treating Shauntavia Brackin/Extender: Rowan Blase in Treatment: 0 Foot Assessment Items Site Locations + = Sensation present, - = Sensation absent, C = Callus, U = Ulcer R = Redness, W = Warmth, M = Maceration, PU = Pre-ulcerative lesion F = Fissure, S = Swelling, D = Dryness Assessment Right: Left: Other Deformity: No No Prior Foot Ulcer: No No Prior Amputation: No No Charcot Joint: No No Ambulatory Status: Non-ambulatory Assistance Device: Stretcher Gait: Electronic Signature(s) Signed: 02/23/2021 4:40:38 PM By: Rogers Blocker RN Entered By: Rogers Blocker on 02/23/2021 12:49:05 Kelsey Strong ([id number]) -------------------------------------------------------------------------------- Nutrition Risk Screening Details Patient Name: Kelsey Strong. Date of Service: 02/23/2021 12:45 PM Medical Record Number: [id number] Patient Account Number: [id number] Date of Birth/Sex: 30-Sep-1995 (25 y.o. F) Treating RN: Rogers Blocker Primary Care Shiree Altemus: Beverely Low Other Clinician: Referring Marisol Giambra: Referral, Self Treating Alexei Ey/Extender: Rowan Blase in Treatment: 0 Height (in): 66 Weight (lbs): 129 Body Mass Index (BMI): 20.8 Nutrition Risk  Screening Items Score Screening NUTRITION RISK SCREEN: I have an illness or condition that made me change the kind and/or amount of food I eat 0 No I eat fewer than two meals per day 0 No I eat few fruits and vegetables, or milk products 0 No I have three or more drinks of beer, liquor or wine almost every day 0 No I have tooth or mouth problems that make it hard for me to eat 0 No I don't always have enough money to buy the food I need 0 No I eat alone most of the time 0 No I take three or more different prescribed or over-the-counter drugs a day 0 No Without wanting to, I have lost or gained 10 pounds in the last six months 0 No I am not always physically able to shop, cook and/or feed myself 0 No Nutrition Protocols Good Risk Protocol Moderate Risk Protocol High Risk Proctocol Risk Level: Good Risk Score: 0 Electronic Signature(s) Signed: 02/23/2021 4:40:38 PM By: Rogers Blocker RN Entered By: Rogers Blocker on 02/23/2021 12:48:55

## 2021-02-23 NOTE — Progress Notes (Signed)
SHATORA, WEATHERBEE ([id number]) Visit Report for 02/23/2021 Allergy List Details Patient Name: Kelsey Strong, Kelsey Strong. Date of Service: 02/23/2021 12:45 PM Medical Record Number: [id number] Patient Account Number: [id number] Date of Birth/Sex: [birth year] (25 y.o. F) Treating RN: Rogers Blocker Primary Care Tiquan Bouch: Beverely Low Other Clinician: Referring Damira Kem: Referral, Self Treating Gianne Shugars/Extender: Allen Derry Weeks in Treatment: 0 Allergies Active Allergies No Known Allergies Allergy Notes Electronic Signature(s) Signed: 02/23/2021 4:40:38 PM By: Rogers Blocker RN Entered By: Rogers Blocker on 02/23/2021 12:42:33 [name] ([id number]) -------------------------------------------------------------------------------- Arrival Information Details Patient Name: [name]. Date of Service: 02/23/2021 12:45 PM Medical Record Number: [id number] Patient Account Number: [id number] Date of Birth/Sex: [birth year] (25 y.o. F) Treating RN: Rogers Blocker Primary Care Monroe Toure: Beverely Low Other Clinician: Referring Delrick Dehart: Referral, Self Treating Shamiracle Gorden/Extender: Rowan Blase in Treatment: 0 Visit Information Patient Arrived: Stretcher Arrival Time: 12:37 Accompanied By: mother Transfer Assistance: None Patient Identification Verified: Yes Secondary Verification Process Completed: Yes History Since Last Visit Notes Patient stayed in stretcher Electronic Signature(s) Signed: 02/23/2021 4:40:38 PM By: Rogers Blocker RN Entered By: Rogers Blocker on 02/23/2021 12:38:12 [name] ([id number]) -------------------------------------------------------------------------------- Clinic Level of Care Assessment Details Patient Name: [name]. Date of Service: 02/23/2021 12:45 PM Medical Record Number: [id number] Patient Account Number: [id number] Date of Birth/Sex: 1996/01/16 (25 y.o. F) Treating RN: Rogers Blocker Primary Care Roxine Whittinghill: Beverely Low Other Clinician: Referring Kayson Bullis: Referral, Self Treating Jaret Coppedge/Extender: Rowan Blase in Treatment: 0 Clinic Level of Care Assessment Items TOOL 2 Quantity Score X - Use when only an EandM is performed on the INITIAL visit 1 0 ASSESSMENTS - Nursing Assessment / Reassessment X - General Physical Exam (combine w/ comprehensive assessment (listed just below) when performed on new 1 20 pt. evals) X- 1 25 Comprehensive Assessment (HX, ROS, Risk Assessments, Wounds Hx, etc.) ASSESSMENTS - Wound and Skin Assessment / Reassessment []  - Simple Wound Assessment / Reassessment - one wound 0 []  - 0 Complex Wound Assessment / Reassessment - multiple wounds []  - 0 Dermatologic / Skin Assessment (not related to wound area) ASSESSMENTS - Ostomy and/or Continence Assessment and Care []  - Incontinence Assessment and Management 0 []  - 0 Ostomy Care Assessment and Management (repouching, etc.) PROCESS - Coordination of Care X - Simple Patient / Family Education for ongoing care 1 15 []  - 0 Complex (extensive) Patient / Family Education for ongoing care X- 1 10 Staff obtains , Records, Test Results / Process Orders []  - 0 Staff telephones HHA, Nursing Homes / Clarify orders / etc []  - 0 Routine Transfer to another Facility (non-emergent condition) []  - 0 Routine Hospital Admission (non-emergent condition) X- 1 15 New Admissions / / Ordering NPWT, Apligraf, etc. []  - 0 Emergency Hospital Admission (emergent condition) X- 1 10 Simple Discharge Coordination []  - 0 Complex (extensive) Discharge Coordination PROCESS - Special Needs []  - Pediatric / Minor Patient Management 0 []  - 0 Isolation Patient Management []  - 0 Hearing / Language / Visual special needs []  - 0 Assessment of Community assistance (transportation, D/C planning, etc.) []  - 0 Additional assistance / Altered mentation []  - 0 Support Surface(s) Assessment (bed, cushion,  seat, etc.) INTERVENTIONS - Wound Cleansing / Measurement []  - Wound Imaging (photographs - any number of wounds) 0 []  - 0 Wound Tracing (instead of photographs) []  - 0 Simple Wound Measurement - one wound []  - 0 Complex Wound Measurement - multiple wounds KAIANNA, DOLEZAL D. ( ) []  - 0 Simple Wound Cleansing -  one wound []  - 0 Complex Wound Cleansing - multiple wounds INTERVENTIONS - Wound Dressings []  - Small Wound Dressing one or multiple wounds 0 []  - 0 Medium Wound Dressing one or multiple wounds []  - 0 Large Wound Dressing one or multiple wounds []  - 0 Application of Medications - injection INTERVENTIONS - Miscellaneous []  - External ear exam 0 []  - 0 Specimen Collection (cultures, biopsies, blood, body fluids, etc.) []  - 0 Specimen(s) / Culture(s) sent or taken to Lab for analysis []  - 0 Patient Transfer (multiple staff / / Similar devices) []  - 0 Simple Staple / Suture removal (25 or less) []  - 0 Complex Staple / Suture removal (26 or more) []  - 0 Hypo / Hyperglycemic Management (close monitor of Blood Glucose) []  - 0 Ankle / Brachial Index (ABI) - do not check if billed separately Has the patient been seen at the hospital within the last three years: Yes Total Score: 95 Level Of Care: New/Established - Level 3 Electronic Signature(s) Signed: 02/23/2021 4:40:38 PM By: RN Entered By: on 02/23/2021 13:21:07 ( ) -------------------------------------------------------------------------------- Encounter Discharge Information Details Patient Name: . Date of Service: 02/23/2021 12:45 PM Medical Record Number: Nurse, adult Patient Account Number: Date of Birth/Sex: [birth year] (25 y.o. F) Treating RN: Primary Care Norleen Xie: 02/25/2021 Other Clinician: Referring Ophia Shamoon: Referral, Self Treating Tayva Easterday/Extender: Rogers Blocker in Treatment:  0 Encounter Discharge Information Items Discharge Condition: Stable Ambulatory Status: Stretcher Discharge Destination: Home Transportation: Ambulance Accompanied By: mother Schedule Follow-up Appointment: No Clinical Summary of Care: Electronic Signature(s) Signed: 02/23/2021 1:21:53 PM By: 02/25/2021 RN Entered By: [name] on 02/23/2021 13:21:53 [name] (02/25/2021) -------------------------------------------------------------------------------- Lower Extremity Assessment Details Patient Name: [id number]. Date of Service: 02/23/2021 12:45 PM Medical Record Number: 05/26/1995 Patient Account Number: [id number] Date of Birth/Sex: 1995-05-09 (25 y.o. F) Treating RN: Rowan Blase Primary Care Azura Tufaro: 02/25/2021 Other Clinician: Referring Soriya Worster: Referral, Self Treating Sanaa Zilberman/Extender: Rogers Blocker in Treatment: 0 Electronic Signature(s) Signed: 02/23/2021 4:40:38 PM By: 02/25/2021 RN Entered By: [name] on 02/23/2021 12:42:28 [name] (02/25/2021) -------------------------------------------------------------------------------- Multi Wound Chart Details Patient Name: [id number]. Date of Service: 02/23/2021 12:45 PM Medical Record Number: 05/26/1995 Patient Account Number: [id number] Date of Birth/Sex: 08/23/1995 (25 y.o. F) Treating RN: Rowan Blase Primary Care Aurore Redinger: 02/25/2021 Other Clinician: Referring Massa Pe: Referral, Self Treating Karra Pink/Extender: Rogers Blocker in Treatment: 0 Vital Signs Height(in): 66 Pulse(bpm): 80 Weight(lbs): 129 Blood Pressure(mmHg): 104/72 Body Mass Index(BMI): 21 Temperature(F): 98.5 Respiratory Rate(breaths/min): 16 Wound Assessments Treatment Notes Electronic Signature(s) Signed: 02/23/2021 1:18:46 PM By: 02/25/2021 RN Entered By: [name] on 02/23/2021 13:18:46 [name]  (02/25/2021) -------------------------------------------------------------------------------- Multi-Disciplinary Care Plan Details Patient Name: [id number]. Date of Service: 02/23/2021 12:45 PM Medical Record Number: 05/26/1995 Patient Account Number: [id number] Date of Birth/Sex: 1995-08-02 (25 y.o. F) Treating RN: Rowan Blase Primary Care Sandy Haye: 02/25/2021 Other Clinician: Referring Beonka Amesquita: Referral, Self Treating Yosmar Ryker/Extender: Rogers Blocker in Treatment: 0 Active Inactive Electronic Signature(s) Signed: 02/23/2021 1:18:40 PM By: 02/25/2021 RN Entered By: [name] on 02/23/2021 13:18:40 [name] (02/25/2021) -------------------------------------------------------------------------------- Pain Assessment Details Patient Name: [id number]. Date of Service: 02/23/2021 12:45 PM Medical Record Number: 05/26/1995 Patient Account Number: [id number] Date of Birth/Sex: Mar 06, 1996 (25 y.o. F) Treating RN: Rowan Blase Primary Care Letonya Mangels: 02/25/2021 Other Clinician: Referring Tyron Manetta: Referral, Self Treating Kona Yusuf/Extender: Rogers Blocker in Treatment: 0  Active Problems Location of Pain Severity and Description of Pain Patient Has Paino Patient Unable to Respond Site Locations Pain Management and Medication Current Pain Management: Electronic Signature(s) Signed: 02/23/2021 4:40:38 PM By: Rogers Blocker RN Entered By: Rogers Blocker on 02/23/2021 12:38:17 [name] ([id number]) -------------------------------------------------------------------------------- Patient/Caregiver Education Details Patient Name: [name]. Date of Service: 02/23/2021 12:45 PM Medical Record Number: [id number] Patient Account Number: [id number] Date of Birth/Gender: Jan 10, 1996 (25 y.o. F) Treating RN: Rogers Blocker Primary Care Physician: Beverely Low Other Clinician: Referring Physician: Referral, Self Treating  Physician/Extender: Rowan Blase in Treatment: 0 Education Assessment Education Provided To: Caregiver mother Education Topics Provided Notes continued care Electronic Signature(s) Signed: 02/23/2021 4:40:38 PM By: Rogers Blocker RN Entered By: Rogers Blocker on 02/23/2021 13:21:24 [name] ([id number]) -------------------------------------------------------------------------------- Vitals Details Patient Name: [name]. Date of Service: 02/23/2021 12:45 PM Medical Record Number: [id number] Patient Account Number: [id number] Date of Birth/Sex: 04-Jul-1995 (25 y.o. F) Treating RN: Rogers Blocker Primary Care Pavneet Markwood: Beverely Low Other Clinician: Referring Sukhman Kocher: Referral, Self Treating Tyler Robidoux/Extender: Rowan Blase in Treatment: 0 Vital Signs Time Taken: 12:38 Temperature (F): 98.5 Height (in): 66 Pulse (bpm): 80 Source: Stated Respiratory Rate (breaths/min): 16 Weight (lbs): 129 Blood Pressure (mmHg): 104/72 Source: Stated Reference Range: 80 - 120 mg / dl Body Mass Index (BMI): 20.8 Electronic Signature(s) Signed: 02/23/2021 4:40:38 PM By: Rogers Blocker RN Entered By: Rogers Blocker on 02/23/2021 12:42:21
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
## 2024-04-13 ENCOUNTER — Encounter: Attending: Internal Medicine | Admitting: Internal Medicine

## 2024-04-13 DIAGNOSIS — S14102A Unspecified injury at C2 level of cervical spinal cord, initial encounter: Secondary | ICD-10-CM | POA: Diagnosis not present

## 2024-04-13 DIAGNOSIS — X58XXXA Exposure to other specified factors, initial encounter: Secondary | ICD-10-CM | POA: Diagnosis not present

## 2024-04-13 DIAGNOSIS — M6128 Paralytic calcification and ossification of muscle, other site: Secondary | ICD-10-CM | POA: Insufficient documentation

## 2024-04-13 DIAGNOSIS — L89313 Pressure ulcer of right buttock, stage 3: Secondary | ICD-10-CM | POA: Insufficient documentation

## 2024-04-22 ENCOUNTER — Encounter: Admitting: Internal Medicine

## 2024-05-04 ENCOUNTER — Encounter: Admitting: Physician Assistant

## 2024-05-04 DIAGNOSIS — L89313 Pressure ulcer of right buttock, stage 3: Secondary | ICD-10-CM | POA: Diagnosis not present

## 2024-05-18 ENCOUNTER — Encounter: Attending: Physician Assistant | Admitting: Physician Assistant

## 2024-05-18 DIAGNOSIS — S14102D Unspecified injury at C2 level of cervical spinal cord, subsequent encounter: Secondary | ICD-10-CM | POA: Diagnosis not present

## 2024-05-18 DIAGNOSIS — L89313 Pressure ulcer of right buttock, stage 3: Secondary | ICD-10-CM | POA: Insufficient documentation

## 2024-05-18 DIAGNOSIS — M6128 Paralytic calcification and ossification of muscle, other site: Secondary | ICD-10-CM | POA: Diagnosis not present

## 2024-06-05 ENCOUNTER — Encounter: Admitting: Physician Assistant

## 2024-06-05 DIAGNOSIS — L89313 Pressure ulcer of right buttock, stage 3: Secondary | ICD-10-CM | POA: Diagnosis not present

## 2024-06-19 ENCOUNTER — Encounter: Admitting: Internal Medicine
